# Patient Record
Sex: Female | Born: 1950 | Race: Black or African American | Hispanic: No | Smoking: Former smoker
Health system: Southern US, Community
[De-identification: ages and names within clinical notes are randomized; demographics above are authoritative.]

## PROBLEM LIST (undated history)

## (undated) DIAGNOSIS — E119 Type 2 diabetes mellitus without complications: Secondary | ICD-10-CM

## (undated) DIAGNOSIS — M792 Neuralgia and neuritis, unspecified: Secondary | ICD-10-CM

## (undated) DIAGNOSIS — K219 Gastro-esophageal reflux disease without esophagitis: Secondary | ICD-10-CM

## (undated) DIAGNOSIS — S62102A Fracture of unspecified carpal bone, left wrist, initial encounter for closed fracture: Secondary | ICD-10-CM

## (undated) DIAGNOSIS — Z972 Presence of dental prosthetic device (complete) (partial): Secondary | ICD-10-CM

## (undated) DIAGNOSIS — M549 Dorsalgia, unspecified: Secondary | ICD-10-CM

## (undated) DIAGNOSIS — K08109 Complete loss of teeth, unspecified cause, unspecified class: Secondary | ICD-10-CM

## (undated) DIAGNOSIS — M79606 Pain in leg, unspecified: Secondary | ICD-10-CM

## (undated) DIAGNOSIS — M1990 Unspecified osteoarthritis, unspecified site: Secondary | ICD-10-CM

## (undated) DIAGNOSIS — F419 Anxiety disorder, unspecified: Secondary | ICD-10-CM

## (undated) DIAGNOSIS — IMO0002 Reserved for concepts with insufficient information to code with codable children: Secondary | ICD-10-CM

## (undated) DIAGNOSIS — Z973 Presence of spectacles and contact lenses: Secondary | ICD-10-CM

## (undated) DIAGNOSIS — G43019 Migraine without aura, intractable, without status migrainosus: Secondary | ICD-10-CM

## (undated) DIAGNOSIS — I10 Essential (primary) hypertension: Secondary | ICD-10-CM

## (undated) DIAGNOSIS — J45909 Unspecified asthma, uncomplicated: Secondary | ICD-10-CM

## (undated) HISTORY — DX: Unspecified osteoarthritis, unspecified site: M19.90

## (undated) HISTORY — DX: Neuralgia and neuritis, unspecified: M79.2

## (undated) HISTORY — DX: Migraine without aura, intractable, without status migrainosus: G43.019

## (undated) HISTORY — DX: Dorsalgia, unspecified: M54.9

## (undated) HISTORY — PX: COLONOSCOPY: SHX174

## (undated) HISTORY — DX: Unspecified asthma, uncomplicated: J45.909

## (undated) HISTORY — DX: Essential (primary) hypertension: I10

## (undated) HISTORY — PX: TONSILLECTOMY: SUR1361

## (undated) HISTORY — DX: Type 2 diabetes mellitus without complications: E11.9

## (undated) HISTORY — DX: Pain in leg, unspecified: M79.606

---

## 1979-04-08 HISTORY — PX: ABDOMINAL HYSTERECTOMY: SHX81

## 2003-04-08 HISTORY — PX: BACK SURGERY: SHX140

## 2003-11-03 ENCOUNTER — Emergency Department (HOSPITAL_COMMUNITY): Admission: EM | Admit: 2003-11-03 | Discharge: 2003-11-03 | Payer: Self-pay | Admitting: Emergency Medicine

## 2003-12-14 ENCOUNTER — Emergency Department (HOSPITAL_COMMUNITY): Admission: EM | Admit: 2003-12-14 | Discharge: 2003-12-14 | Payer: Self-pay | Admitting: Emergency Medicine

## 2004-06-01 ENCOUNTER — Emergency Department (HOSPITAL_COMMUNITY): Admission: EM | Admit: 2004-06-01 | Discharge: 2004-06-01 | Payer: Self-pay | Admitting: Emergency Medicine

## 2004-06-07 ENCOUNTER — Emergency Department (HOSPITAL_COMMUNITY): Admission: EM | Admit: 2004-06-07 | Discharge: 2004-06-07 | Payer: Self-pay | Admitting: Emergency Medicine

## 2004-07-05 ENCOUNTER — Ambulatory Visit: Payer: Self-pay | Admitting: Family Medicine

## 2004-07-08 ENCOUNTER — Ambulatory Visit: Payer: Self-pay | Admitting: *Deleted

## 2004-07-15 ENCOUNTER — Ambulatory Visit: Payer: Self-pay | Admitting: Family Medicine

## 2004-07-23 ENCOUNTER — Ambulatory Visit (HOSPITAL_COMMUNITY): Admission: RE | Admit: 2004-07-23 | Discharge: 2004-07-23 | Payer: Self-pay | Admitting: Family Medicine

## 2004-07-25 ENCOUNTER — Ambulatory Visit: Payer: Self-pay | Admitting: Family Medicine

## 2004-08-08 ENCOUNTER — Ambulatory Visit: Payer: Self-pay | Admitting: Family Medicine

## 2004-08-13 ENCOUNTER — Ambulatory Visit: Payer: Self-pay | Admitting: Family Medicine

## 2004-09-19 ENCOUNTER — Ambulatory Visit: Payer: Self-pay | Admitting: Family Medicine

## 2004-10-17 ENCOUNTER — Ambulatory Visit: Payer: Self-pay | Admitting: Family Medicine

## 2004-11-01 ENCOUNTER — Ambulatory Visit: Payer: Self-pay | Admitting: Family Medicine

## 2004-11-22 ENCOUNTER — Ambulatory Visit: Payer: Self-pay | Admitting: Family Medicine

## 2005-02-21 ENCOUNTER — Ambulatory Visit: Payer: Self-pay | Admitting: Family Medicine

## 2005-04-25 ENCOUNTER — Ambulatory Visit: Payer: Self-pay | Admitting: Family Medicine

## 2005-05-02 ENCOUNTER — Ambulatory Visit: Payer: Self-pay | Admitting: Family Medicine

## 2005-05-07 ENCOUNTER — Emergency Department (HOSPITAL_COMMUNITY): Admission: EM | Admit: 2005-05-07 | Discharge: 2005-05-08 | Payer: Self-pay | Admitting: Emergency Medicine

## 2005-05-09 ENCOUNTER — Ambulatory Visit: Payer: Self-pay | Admitting: Family Medicine

## 2005-05-29 ENCOUNTER — Ambulatory Visit: Payer: Self-pay | Admitting: Family Medicine

## 2005-05-30 ENCOUNTER — Ambulatory Visit: Payer: Self-pay | Admitting: Family Medicine

## 2005-06-06 ENCOUNTER — Ambulatory Visit: Payer: Self-pay | Admitting: Family Medicine

## 2005-06-12 ENCOUNTER — Ambulatory Visit: Payer: Self-pay | Admitting: Family Medicine

## 2005-06-19 ENCOUNTER — Ambulatory Visit: Payer: Self-pay | Admitting: Internal Medicine

## 2005-07-03 ENCOUNTER — Ambulatory Visit: Payer: Self-pay | Admitting: Family Medicine

## 2005-07-07 ENCOUNTER — Ambulatory Visit: Payer: Self-pay | Admitting: Family Medicine

## 2005-07-25 ENCOUNTER — Ambulatory Visit (HOSPITAL_COMMUNITY): Admission: RE | Admit: 2005-07-25 | Discharge: 2005-07-25 | Payer: Self-pay | Admitting: Family Medicine

## 2005-08-01 ENCOUNTER — Ambulatory Visit: Payer: Self-pay | Admitting: Family Medicine

## 2005-08-21 ENCOUNTER — Ambulatory Visit (HOSPITAL_BASED_OUTPATIENT_CLINIC_OR_DEPARTMENT_OTHER): Admission: RE | Admit: 2005-08-21 | Discharge: 2005-08-21 | Payer: Self-pay | Admitting: Orthopaedic Surgery

## 2005-09-18 ENCOUNTER — Ambulatory Visit: Payer: Self-pay | Admitting: Family Medicine

## 2005-11-12 ENCOUNTER — Ambulatory Visit: Payer: Self-pay | Admitting: Family Medicine

## 2006-02-12 ENCOUNTER — Ambulatory Visit: Payer: Self-pay | Admitting: Family Medicine

## 2006-02-27 ENCOUNTER — Ambulatory Visit (HOSPITAL_COMMUNITY): Admission: RE | Admit: 2006-02-27 | Discharge: 2006-02-27 | Payer: Self-pay | Admitting: Orthopaedic Surgery

## 2006-05-20 ENCOUNTER — Inpatient Hospital Stay (HOSPITAL_COMMUNITY): Admission: RE | Admit: 2006-05-20 | Discharge: 2006-05-23 | Payer: Self-pay | Admitting: Orthopaedic Surgery

## 2006-07-31 ENCOUNTER — Ambulatory Visit: Payer: Self-pay | Admitting: Internal Medicine

## 2006-10-06 ENCOUNTER — Ambulatory Visit: Payer: Self-pay | Admitting: Family Medicine

## 2006-10-07 ENCOUNTER — Ambulatory Visit (HOSPITAL_COMMUNITY): Admission: RE | Admit: 2006-10-07 | Discharge: 2006-10-07 | Payer: Self-pay | Admitting: Family Medicine

## 2006-10-15 ENCOUNTER — Ambulatory Visit: Payer: Self-pay | Admitting: Family Medicine

## 2006-10-23 ENCOUNTER — Ambulatory Visit: Payer: Self-pay | Admitting: Internal Medicine

## 2006-11-22 DIAGNOSIS — F17200 Nicotine dependence, unspecified, uncomplicated: Secondary | ICD-10-CM | POA: Insufficient documentation

## 2006-11-22 DIAGNOSIS — I10 Essential (primary) hypertension: Secondary | ICD-10-CM | POA: Insufficient documentation

## 2006-11-22 DIAGNOSIS — N3946 Mixed incontinence: Secondary | ICD-10-CM | POA: Insufficient documentation

## 2006-11-22 DIAGNOSIS — F39 Unspecified mood [affective] disorder: Secondary | ICD-10-CM | POA: Insufficient documentation

## 2006-11-22 DIAGNOSIS — E785 Hyperlipidemia, unspecified: Secondary | ICD-10-CM

## 2006-11-22 DIAGNOSIS — R809 Proteinuria, unspecified: Secondary | ICD-10-CM | POA: Insufficient documentation

## 2006-12-22 ENCOUNTER — Telehealth (INDEPENDENT_AMBULATORY_CARE_PROVIDER_SITE_OTHER): Payer: Self-pay | Admitting: *Deleted

## 2006-12-23 ENCOUNTER — Encounter (INDEPENDENT_AMBULATORY_CARE_PROVIDER_SITE_OTHER): Payer: Self-pay | Admitting: *Deleted

## 2006-12-24 ENCOUNTER — Ambulatory Visit: Payer: Self-pay | Admitting: Internal Medicine

## 2006-12-24 DIAGNOSIS — J209 Acute bronchitis, unspecified: Secondary | ICD-10-CM

## 2006-12-29 ENCOUNTER — Ambulatory Visit: Payer: Self-pay | Admitting: Internal Medicine

## 2006-12-30 ENCOUNTER — Encounter (INDEPENDENT_AMBULATORY_CARE_PROVIDER_SITE_OTHER): Payer: Self-pay | Admitting: Internal Medicine

## 2007-01-10 DIAGNOSIS — E210 Primary hyperparathyroidism: Secondary | ICD-10-CM

## 2007-01-10 LAB — CONVERTED CEMR LAB: PTH: 105 pg/mL — ABNORMAL HIGH (ref 14.0–72.0)

## 2007-01-14 ENCOUNTER — Ambulatory Visit (HOSPITAL_COMMUNITY): Admission: RE | Admit: 2007-01-14 | Discharge: 2007-01-14 | Payer: Self-pay | Admitting: Internal Medicine

## 2007-01-21 ENCOUNTER — Telehealth (INDEPENDENT_AMBULATORY_CARE_PROVIDER_SITE_OTHER): Payer: Self-pay | Admitting: Internal Medicine

## 2007-02-03 ENCOUNTER — Ambulatory Visit: Payer: Self-pay | Admitting: Internal Medicine

## 2007-02-12 ENCOUNTER — Ambulatory Visit (HOSPITAL_COMMUNITY): Admission: RE | Admit: 2007-02-12 | Discharge: 2007-02-12 | Payer: Self-pay | Admitting: General Surgery

## 2007-03-17 ENCOUNTER — Ambulatory Visit (HOSPITAL_COMMUNITY): Admission: RE | Admit: 2007-03-17 | Discharge: 2007-03-18 | Payer: Self-pay | Admitting: General Surgery

## 2007-03-17 ENCOUNTER — Encounter (INDEPENDENT_AMBULATORY_CARE_PROVIDER_SITE_OTHER): Payer: Self-pay | Admitting: General Surgery

## 2007-03-25 ENCOUNTER — Telehealth (INDEPENDENT_AMBULATORY_CARE_PROVIDER_SITE_OTHER): Payer: Self-pay | Admitting: Internal Medicine

## 2007-04-08 HISTORY — PX: THYROID CYST EXCISION: SHX2511

## 2007-04-08 HISTORY — PX: OTHER SURGICAL HISTORY: SHX169

## 2007-04-19 ENCOUNTER — Encounter (INDEPENDENT_AMBULATORY_CARE_PROVIDER_SITE_OTHER): Payer: Self-pay | Admitting: Internal Medicine

## 2007-05-27 ENCOUNTER — Ambulatory Visit: Payer: Self-pay | Admitting: Internal Medicine

## 2007-05-27 ENCOUNTER — Ambulatory Visit (HOSPITAL_COMMUNITY): Admission: RE | Admit: 2007-05-27 | Discharge: 2007-05-27 | Payer: Self-pay | Admitting: Internal Medicine

## 2007-05-27 DIAGNOSIS — G5600 Carpal tunnel syndrome, unspecified upper limb: Secondary | ICD-10-CM

## 2007-05-27 DIAGNOSIS — M25519 Pain in unspecified shoulder: Secondary | ICD-10-CM | POA: Insufficient documentation

## 2007-06-16 ENCOUNTER — Encounter (INDEPENDENT_AMBULATORY_CARE_PROVIDER_SITE_OTHER): Payer: Self-pay | Admitting: Internal Medicine

## 2007-06-16 ENCOUNTER — Encounter: Admission: RE | Admit: 2007-06-16 | Discharge: 2007-09-14 | Payer: Self-pay | Admitting: Internal Medicine

## 2007-07-23 ENCOUNTER — Encounter (INDEPENDENT_AMBULATORY_CARE_PROVIDER_SITE_OTHER): Payer: Self-pay | Admitting: Internal Medicine

## 2007-07-23 ENCOUNTER — Encounter (INDEPENDENT_AMBULATORY_CARE_PROVIDER_SITE_OTHER): Payer: Self-pay | Admitting: Family Medicine

## 2007-07-27 ENCOUNTER — Ambulatory Visit: Payer: Self-pay | Admitting: Internal Medicine

## 2007-07-27 DIAGNOSIS — K219 Gastro-esophageal reflux disease without esophagitis: Secondary | ICD-10-CM

## 2007-07-27 DIAGNOSIS — G609 Hereditary and idiopathic neuropathy, unspecified: Secondary | ICD-10-CM | POA: Insufficient documentation

## 2007-07-27 DIAGNOSIS — J309 Allergic rhinitis, unspecified: Secondary | ICD-10-CM | POA: Insufficient documentation

## 2007-07-27 LAB — CONVERTED CEMR LAB
Albumin: 4.6 g/dL (ref 3.5–5.2)
Alkaline Phosphatase: 67 units/L (ref 39–117)
HDL: 66 mg/dL (ref >39)
LDL Cholesterol: 96 mg/dL (ref 0–99)
Total Bilirubin: 0.4 mg/dL (ref 0.3–1.2)
VLDL: 12 mg/dL (ref 0–40)

## 2007-08-08 ENCOUNTER — Encounter (INDEPENDENT_AMBULATORY_CARE_PROVIDER_SITE_OTHER): Payer: Self-pay | Admitting: Internal Medicine

## 2007-08-10 ENCOUNTER — Encounter (INDEPENDENT_AMBULATORY_CARE_PROVIDER_SITE_OTHER): Payer: Self-pay | Admitting: Family Medicine

## 2007-08-10 ENCOUNTER — Ambulatory Visit: Payer: Self-pay | Admitting: Internal Medicine

## 2007-08-16 ENCOUNTER — Ambulatory Visit: Payer: Self-pay | Admitting: Nurse Practitioner

## 2007-08-16 DIAGNOSIS — E669 Obesity, unspecified: Secondary | ICD-10-CM

## 2007-08-16 DIAGNOSIS — J329 Chronic sinusitis, unspecified: Secondary | ICD-10-CM | POA: Insufficient documentation

## 2007-08-16 LAB — CONVERTED CEMR LAB
Calcium, Total (PTH): 10 mg/dL (ref 8.4–10.5)
PTH: 39.1 pg/mL (ref 14.0–72.0)
TSH: 0.613 microintl units/mL (ref 0.350–5.50)

## 2007-08-18 ENCOUNTER — Encounter (INDEPENDENT_AMBULATORY_CARE_PROVIDER_SITE_OTHER): Payer: Self-pay | Admitting: Nurse Practitioner

## 2007-08-24 ENCOUNTER — Encounter (INDEPENDENT_AMBULATORY_CARE_PROVIDER_SITE_OTHER): Payer: Self-pay | Admitting: Internal Medicine

## 2007-09-15 ENCOUNTER — Encounter: Admission: RE | Admit: 2007-09-15 | Discharge: 2007-12-14 | Payer: Self-pay | Admitting: Internal Medicine

## 2007-09-20 ENCOUNTER — Encounter (INDEPENDENT_AMBULATORY_CARE_PROVIDER_SITE_OTHER): Payer: Self-pay | Admitting: Family Medicine

## 2007-09-21 ENCOUNTER — Ambulatory Visit: Payer: Self-pay | Admitting: Internal Medicine

## 2007-09-21 DIAGNOSIS — R609 Edema, unspecified: Secondary | ICD-10-CM

## 2007-09-22 ENCOUNTER — Encounter (INDEPENDENT_AMBULATORY_CARE_PROVIDER_SITE_OTHER): Payer: Self-pay | Admitting: Internal Medicine

## 2007-09-27 LAB — CONVERTED CEMR LAB
CO2: 27 meq/L (ref 19–32)
Calcium: 10.4 mg/dL (ref 8.4–10.5)
Creatinine, Ser: 0.75 mg/dL (ref 0.40–1.20)
Sodium: 142 meq/L (ref 135–145)

## 2007-10-01 ENCOUNTER — Ambulatory Visit: Payer: Self-pay | Admitting: Internal Medicine

## 2007-10-01 LAB — CONVERTED CEMR LAB: Blood Glucose, AC Bkfst: 118 mg/dL

## 2007-10-05 ENCOUNTER — Encounter (INDEPENDENT_AMBULATORY_CARE_PROVIDER_SITE_OTHER): Payer: Self-pay | Admitting: Internal Medicine

## 2007-10-07 ENCOUNTER — Ambulatory Visit: Payer: Self-pay | Admitting: Internal Medicine

## 2007-10-07 DIAGNOSIS — M722 Plantar fascial fibromatosis: Secondary | ICD-10-CM

## 2007-10-07 DIAGNOSIS — E119 Type 2 diabetes mellitus without complications: Secondary | ICD-10-CM

## 2007-10-07 LAB — CONVERTED CEMR LAB
Basophils Absolute: 0 10*3/uL (ref 0.0–0.1)
Basophils Relative: 0 % (ref 0–1)
Bilirubin Urine: NEGATIVE
Blood Glucose, Fingerstick: 120
Glucose, Urine, Semiquant: NEGATIVE
Ketones, urine, test strip: NEGATIVE
Lymphocytes Relative: 58 % — ABNORMAL HIGH (ref 12–46)
MCHC: 31.3 g/dL (ref 30.0–36.0)
Monocytes Absolute: 0.5 10*3/uL (ref 0.1–1.0)
Neutro Abs: 1.7 10*3/uL (ref 1.7–7.7)
OCCULT 1: NEGATIVE
OCCULT 2: NEGATIVE
OCCULT 3: NEGATIVE
Platelets: 254 10*3/uL (ref 150–400)
RDW: 14.2 % (ref 11.5–15.5)
Specific Gravity, Urine: 1.015
pH: 8

## 2007-10-11 ENCOUNTER — Ambulatory Visit (HOSPITAL_COMMUNITY): Admission: RE | Admit: 2007-10-11 | Discharge: 2007-10-11 | Payer: Self-pay | Admitting: Internal Medicine

## 2007-10-11 ENCOUNTER — Encounter (INDEPENDENT_AMBULATORY_CARE_PROVIDER_SITE_OTHER): Payer: Self-pay | Admitting: Family Medicine

## 2007-10-14 ENCOUNTER — Encounter (INDEPENDENT_AMBULATORY_CARE_PROVIDER_SITE_OTHER): Payer: Self-pay | Admitting: Family Medicine

## 2007-11-02 ENCOUNTER — Ambulatory Visit: Payer: Self-pay | Admitting: Family Medicine

## 2007-11-02 ENCOUNTER — Encounter (INDEPENDENT_AMBULATORY_CARE_PROVIDER_SITE_OTHER): Payer: Self-pay | Admitting: Internal Medicine

## 2008-04-03 ENCOUNTER — Encounter (INDEPENDENT_AMBULATORY_CARE_PROVIDER_SITE_OTHER): Payer: Self-pay | Admitting: Family Medicine

## 2008-04-06 ENCOUNTER — Ambulatory Visit: Payer: Self-pay | Admitting: Internal Medicine

## 2008-04-06 LAB — CONVERTED CEMR LAB: Hgb A1c MFr Bld: 5.7 %

## 2008-04-11 ENCOUNTER — Emergency Department (HOSPITAL_COMMUNITY): Admission: EM | Admit: 2008-04-11 | Discharge: 2008-04-11 | Payer: Self-pay | Admitting: Emergency Medicine

## 2008-08-01 ENCOUNTER — Ambulatory Visit: Payer: Self-pay | Admitting: Internal Medicine

## 2008-08-01 DIAGNOSIS — B351 Tinea unguium: Secondary | ICD-10-CM | POA: Insufficient documentation

## 2008-08-01 DIAGNOSIS — R252 Cramp and spasm: Secondary | ICD-10-CM

## 2008-08-07 ENCOUNTER — Telehealth (INDEPENDENT_AMBULATORY_CARE_PROVIDER_SITE_OTHER): Payer: Self-pay | Admitting: Internal Medicine

## 2008-08-10 LAB — CONVERTED CEMR LAB
AST: 17 units/L (ref 0–37)
Albumin: 4.6 g/dL (ref 3.5–5.2)
Alkaline Phosphatase: 81 units/L (ref 39–117)
BUN: 20 mg/dL (ref 6–23)
Glucose, Bld: 97 mg/dL (ref 70–99)
Potassium: 4.6 meq/L (ref 3.5–5.3)
Sodium: 140 meq/L (ref 135–145)
Total Bilirubin: 0.5 mg/dL (ref 0.3–1.2)
Total Protein: 8.3 g/dL (ref 6.0–8.3)

## 2008-08-11 ENCOUNTER — Ambulatory Visit: Payer: Self-pay | Admitting: Internal Medicine

## 2008-10-05 ENCOUNTER — Ambulatory Visit: Payer: Self-pay | Admitting: Internal Medicine

## 2008-10-05 LAB — CONVERTED CEMR LAB: Hgb A1c MFr Bld: 6.1 %

## 2008-10-11 ENCOUNTER — Ambulatory Visit (HOSPITAL_COMMUNITY): Admission: RE | Admit: 2008-10-11 | Discharge: 2008-10-11 | Payer: Self-pay | Admitting: Internal Medicine

## 2008-10-13 LAB — CONVERTED CEMR LAB
Eosinophils Absolute: 0.1 10*3/uL (ref 0.0–0.7)
Eosinophils Relative: 1 % (ref 0–5)
HCT: 43.4 % (ref 36.0–46.0)
HDL: 55 mg/dL (ref >39)
Hemoglobin: 13.7 g/dL (ref 12.0–15.0)
LDL Cholesterol: 82 mg/dL (ref 0–99)
Lymphocytes Relative: 50 % — ABNORMAL HIGH (ref 12–46)
Lymphs Abs: 3.3 10*3/uL (ref 0.7–4.0)
MCV: 93.5 fL (ref 78.0–100.0)
Microalb, Ur: 7.22 mg/dL — ABNORMAL HIGH (ref 0.00–1.89)
Monocytes Absolute: 0.6 10*3/uL (ref 0.1–1.0)
Monocytes Relative: 9 % (ref 3–12)
Total CHOL/HDL Ratio: 2.9
Triglycerides: 116 mg/dL (ref <150)
VLDL: 23 mg/dL (ref 0–40)
WBC: 6.5 10*3/uL (ref 4.0–10.5)

## 2008-10-19 ENCOUNTER — Telehealth (INDEPENDENT_AMBULATORY_CARE_PROVIDER_SITE_OTHER): Payer: Self-pay | Admitting: *Deleted

## 2009-01-02 ENCOUNTER — Ambulatory Visit: Payer: Self-pay | Admitting: Internal Medicine

## 2009-01-02 DIAGNOSIS — R109 Unspecified abdominal pain: Secondary | ICD-10-CM

## 2009-01-02 DIAGNOSIS — J45909 Unspecified asthma, uncomplicated: Secondary | ICD-10-CM | POA: Insufficient documentation

## 2009-01-02 LAB — CONVERTED CEMR LAB
Cholesterol: 143 mg/dL (ref 0–200)
HDL: 51 mg/dL (ref >39)
Total CHOL/HDL Ratio: 2.8
Triglycerides: 75 mg/dL (ref <150)
VLDL: 15 mg/dL (ref 0–40)
Whiff Test: POSITIVE

## 2009-01-05 ENCOUNTER — Ambulatory Visit (HOSPITAL_COMMUNITY): Admission: RE | Admit: 2009-01-05 | Discharge: 2009-01-05 | Payer: Self-pay | Admitting: Internal Medicine

## 2009-01-17 ENCOUNTER — Encounter (INDEPENDENT_AMBULATORY_CARE_PROVIDER_SITE_OTHER): Payer: Self-pay | Admitting: Internal Medicine

## 2009-01-23 ENCOUNTER — Ambulatory Visit: Payer: Self-pay | Admitting: Internal Medicine

## 2009-02-01 ENCOUNTER — Encounter (INDEPENDENT_AMBULATORY_CARE_PROVIDER_SITE_OTHER): Payer: Self-pay | Admitting: Internal Medicine

## 2009-02-01 DIAGNOSIS — D126 Benign neoplasm of colon, unspecified: Secondary | ICD-10-CM

## 2009-02-01 DIAGNOSIS — K648 Other hemorrhoids: Secondary | ICD-10-CM | POA: Insufficient documentation

## 2009-02-01 DIAGNOSIS — K5730 Diverticulosis of large intestine without perforation or abscess without bleeding: Secondary | ICD-10-CM | POA: Insufficient documentation

## 2009-02-09 ENCOUNTER — Encounter (INDEPENDENT_AMBULATORY_CARE_PROVIDER_SITE_OTHER): Payer: Self-pay | Admitting: Internal Medicine

## 2009-02-13 ENCOUNTER — Ambulatory Visit: Payer: Self-pay | Admitting: Internal Medicine

## 2009-02-13 LAB — CONVERTED CEMR LAB
ALT: 18 units/L (ref 0–35)
Albumin: 4.8 g/dL (ref 3.5–5.2)
Indirect Bilirubin: 0.6 mg/dL (ref 0.0–0.9)
Total Protein: 7.8 g/dL (ref 6.0–8.3)

## 2009-03-12 ENCOUNTER — Encounter (INDEPENDENT_AMBULATORY_CARE_PROVIDER_SITE_OTHER): Payer: Self-pay | Admitting: Internal Medicine

## 2009-03-26 ENCOUNTER — Ambulatory Visit: Payer: Self-pay | Admitting: Internal Medicine

## 2009-03-27 ENCOUNTER — Encounter (INDEPENDENT_AMBULATORY_CARE_PROVIDER_SITE_OTHER): Payer: Self-pay | Admitting: Internal Medicine

## 2009-05-04 LAB — CONVERTED CEMR LAB
ALT: 18 units/L (ref 0–35)
Bilirubin, Direct: 0.1 mg/dL (ref 0.0–0.3)

## 2009-05-09 ENCOUNTER — Ambulatory Visit: Payer: Self-pay | Admitting: Internal Medicine

## 2009-05-09 DIAGNOSIS — J069 Acute upper respiratory infection, unspecified: Secondary | ICD-10-CM | POA: Insufficient documentation

## 2009-05-09 LAB — CONVERTED CEMR LAB: Hgb A1c MFr Bld: 5.8 %

## 2009-09-13 ENCOUNTER — Encounter (INDEPENDENT_AMBULATORY_CARE_PROVIDER_SITE_OTHER): Payer: Self-pay | Admitting: Internal Medicine

## 2009-10-21 ENCOUNTER — Encounter (INDEPENDENT_AMBULATORY_CARE_PROVIDER_SITE_OTHER): Payer: Self-pay | Admitting: Internal Medicine

## 2009-10-25 ENCOUNTER — Ambulatory Visit (HOSPITAL_COMMUNITY): Admission: RE | Admit: 2009-10-25 | Discharge: 2009-10-25 | Payer: Self-pay | Admitting: Internal Medicine

## 2009-12-07 ENCOUNTER — Ambulatory Visit: Payer: Self-pay | Admitting: Internal Medicine

## 2009-12-07 DIAGNOSIS — N760 Acute vaginitis: Secondary | ICD-10-CM | POA: Insufficient documentation

## 2009-12-07 DIAGNOSIS — R82998 Other abnormal findings in urine: Secondary | ICD-10-CM | POA: Insufficient documentation

## 2009-12-07 LAB — CONVERTED CEMR LAB
Bilirubin Urine: NEGATIVE
Blood Glucose, Fingerstick: 95
Urobilinogen, UA: 1
Whiff Test: POSITIVE
pH: 6

## 2009-12-08 ENCOUNTER — Encounter (INDEPENDENT_AMBULATORY_CARE_PROVIDER_SITE_OTHER): Payer: Self-pay | Admitting: Internal Medicine

## 2009-12-11 ENCOUNTER — Encounter (INDEPENDENT_AMBULATORY_CARE_PROVIDER_SITE_OTHER): Payer: Self-pay | Admitting: Internal Medicine

## 2009-12-11 LAB — CONVERTED CEMR LAB
ALT: 14 units/L (ref 0–35)
BUN: 18 mg/dL (ref 6–23)
CO2: 29 meq/L (ref 19–32)
Calcium: 10.7 mg/dL — ABNORMAL HIGH (ref 8.4–10.5)
Chloride: 105 meq/L (ref 96–112)
Cholesterol: 180 mg/dL (ref 0–200)
Creatinine, Ser: 0.73 mg/dL (ref 0.40–1.20)
Creatinine, Urine: 204.6 mg/dL
Glucose, Bld: 85 mg/dL (ref 70–99)
HDL: 49 mg/dL (ref >39)
Hemoglobin: 13.4 g/dL (ref 12.0–15.0)
Lymphocytes Relative: 59 % — ABNORMAL HIGH (ref 12–46)
Microalb Creat Ratio: 127.7 mg/g — ABNORMAL HIGH (ref 0.0–30.0)
Monocytes Absolute: 0.5 10*3/uL (ref 0.1–1.0)
Monocytes Relative: 8 % (ref 3–12)
Neutro Abs: 2 10*3/uL (ref 1.7–7.7)
RBC: 4.51 M/uL (ref 3.87–5.11)
Total CHOL/HDL Ratio: 3.7

## 2009-12-14 ENCOUNTER — Telehealth (INDEPENDENT_AMBULATORY_CARE_PROVIDER_SITE_OTHER): Payer: Self-pay | Admitting: Internal Medicine

## 2009-12-14 ENCOUNTER — Ambulatory Visit: Payer: Self-pay | Admitting: Internal Medicine

## 2009-12-24 ENCOUNTER — Encounter (INDEPENDENT_AMBULATORY_CARE_PROVIDER_SITE_OTHER): Payer: Self-pay | Admitting: Internal Medicine

## 2009-12-24 LAB — CONVERTED CEMR LAB: OCCULT 1: NEGATIVE

## 2009-12-25 ENCOUNTER — Telehealth (INDEPENDENT_AMBULATORY_CARE_PROVIDER_SITE_OTHER): Payer: Self-pay | Admitting: Internal Medicine

## 2009-12-25 ENCOUNTER — Encounter (INDEPENDENT_AMBULATORY_CARE_PROVIDER_SITE_OTHER): Payer: Self-pay | Admitting: Internal Medicine

## 2009-12-27 ENCOUNTER — Ambulatory Visit: Payer: Self-pay | Admitting: Internal Medicine

## 2009-12-27 DIAGNOSIS — N390 Urinary tract infection, site not specified: Secondary | ICD-10-CM | POA: Insufficient documentation

## 2009-12-27 LAB — CONVERTED CEMR LAB
Glucose, Urine, Semiquant: NEGATIVE
Protein, U semiquant: 30
Specific Gravity, Urine: >=1.03

## 2009-12-28 ENCOUNTER — Encounter (INDEPENDENT_AMBULATORY_CARE_PROVIDER_SITE_OTHER): Payer: Self-pay | Admitting: Internal Medicine

## 2010-01-02 LAB — CONVERTED CEMR LAB: Calcium, Total (PTH): 10.7 mg/dL — ABNORMAL HIGH (ref 8.4–10.5)

## 2010-01-10 ENCOUNTER — Ambulatory Visit: Payer: Self-pay | Admitting: Physician Assistant

## 2010-01-10 LAB — CONVERTED CEMR LAB
Protein, U semiquant: 30
Specific Gravity, Urine: 1.015
Urobilinogen, UA: 1
pH: 7.5

## 2010-01-11 ENCOUNTER — Encounter (INDEPENDENT_AMBULATORY_CARE_PROVIDER_SITE_OTHER): Payer: Self-pay | Admitting: Internal Medicine

## 2010-02-12 ENCOUNTER — Ambulatory Visit: Payer: Self-pay | Admitting: Internal Medicine

## 2010-02-13 ENCOUNTER — Encounter (INDEPENDENT_AMBULATORY_CARE_PROVIDER_SITE_OTHER): Payer: Self-pay | Admitting: Internal Medicine

## 2010-02-13 LAB — CONVERTED CEMR LAB
Glucose, Urine, Semiquant: NEGATIVE
Nitrite: NEGATIVE
Protein, U semiquant: 100
Specific Gravity, Urine: 1.02
Urobilinogen, UA: 2

## 2010-02-21 ENCOUNTER — Ambulatory Visit: Payer: Self-pay | Admitting: Internal Medicine

## 2010-03-04 LAB — CONVERTED CEMR LAB
Albumin: 4.4 g/dL (ref 3.5–5.2)
Bilirubin, Direct: 0.1 mg/dL (ref 0.0–0.3)
LDL Cholesterol: 80 mg/dL (ref 0–99)
Total Bilirubin: 0.4 mg/dL (ref 0.3–1.2)
Total CHOL/HDL Ratio: 2.6
VLDL: 14 mg/dL (ref 0–40)

## 2010-03-14 ENCOUNTER — Ambulatory Visit: Payer: Self-pay | Admitting: Internal Medicine

## 2010-03-15 ENCOUNTER — Encounter (INDEPENDENT_AMBULATORY_CARE_PROVIDER_SITE_OTHER): Payer: Self-pay | Admitting: Internal Medicine

## 2010-03-16 ENCOUNTER — Encounter (INDEPENDENT_AMBULATORY_CARE_PROVIDER_SITE_OTHER): Payer: Self-pay | Admitting: Internal Medicine

## 2010-03-16 LAB — CONVERTED CEMR LAB
Calcium: 10.4 mg/dL (ref 8.4–10.5)
PTH: 50.6 pg/mL (ref 14.0–72.0)

## 2010-03-26 ENCOUNTER — Encounter (INDEPENDENT_AMBULATORY_CARE_PROVIDER_SITE_OTHER): Payer: Self-pay | Admitting: Internal Medicine

## 2010-05-01 ENCOUNTER — Telehealth (INDEPENDENT_AMBULATORY_CARE_PROVIDER_SITE_OTHER): Payer: Self-pay | Admitting: Internal Medicine

## 2010-05-07 ENCOUNTER — Encounter (INDEPENDENT_AMBULATORY_CARE_PROVIDER_SITE_OTHER): Payer: Self-pay | Admitting: Internal Medicine

## 2010-05-07 ENCOUNTER — Ambulatory Visit: Admit: 2010-05-07 | Payer: Self-pay | Admitting: Internal Medicine

## 2010-05-07 DIAGNOSIS — J019 Acute sinusitis, unspecified: Secondary | ICD-10-CM | POA: Insufficient documentation

## 2010-05-07 NOTE — Consult Note (Signed)
Summary: [facility]  [facility]   Imported By: Arta Bruce 12/13/2009 12:38:56  _____________________________________________________________________  External Attachment:    Type:   Image     Comment:   External Document

## 2010-05-07 NOTE — Progress Notes (Signed)
Summary: Needs PA for Lipitor  Phone Note Outgoing Call   Summary of Call: Needs PA for Lipitor -- has no earlier medication trials.  Do you want to proceed with PA? Initial call taken by: Dutch Quint RN,  December 14, 2009 11:32 AM  Follow-up for Phone Call        Switch to Pravastatin 80 mg for 2 months and schedule her to come in for FLP and liver profile at the end of that time. Rx switched To call if problems with the switch Let her know her labs were fine except for a slightly high calcium, so that's why we are rechecking her PTH. Follow-up by: Julieanne Manson MD,  December 14, 2009 5:58 PM  Additional Follow-up for Phone Call Additional follow up Details #1::        Lab appt. 02/21/10.  Advised of new Rx and lab results.  Dutch Quint RN  December 17, 2009 9:34 AM     New/Updated Medications: PRAVASTATIN SODIUM 80 MG TABS (PRAVASTATIN SODIUM) 1 tab by mouth daily with meal Prescriptions: PRAVASTATIN SODIUM 80 MG TABS (PRAVASTATIN SODIUM) 1 tab by mouth daily with meal  #30 x 11   Entered and Authorized by:   Julieanne Manson MD   Signed by:   Julieanne Manson MD on 12/14/2009   Method used:   Electronically to        [facility] [address] [phone]* (retail)       [address]       [place], [place]  [address]       Ph: [phone]       Fax: [phone]   RxID:   [id number]

## 2010-05-07 NOTE — Assessment & Plan Note (Signed)
Summary: CPP///KT   Vital Signs:  Patient profile:   60 year old female Menstrual status:  hysterectomy Height:      62.5 inches Weight:      150.4 pounds BMI:     27.17 Temp:     98.4 degrees F oral Pulse rate:   88 / minute Pulse rhythm:   regular Resp:     16 per minute BP sitting:   96 / 74  (left arm) Cuff size:   regular  Vitals Entered By: Michelle Nasuti (December 07, 2009 10:58 AM) CC: CPP Is Patient Diabetic? Yes CBG Result 95 CBG Device ID FAST [id number]  Does patient need assistance? Functional Status Self care Ambulation Normal   CC:  CPP.  History of Present Illness: 60 yo female here for CPP.    Allergies: 1)  Codeine  Past History:  Past Medical History: Reviewed history from 10/07/2007 and no changes required. 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 1.  1970s:  Hysterectomy (TAH)with right oophorectomy--fibroids 2.  1989   left knee arthroscopy 3.  05/2006:  L4/5 discectomy and fusion:  Dr. Sharolyn Douglas 4.  03/16/2007:  Parathyroid Adenoma excision 5.  02/08/08:  Dr Amanda Pea:  right carpal tunnel release and repair to right thumb MCP joint  Family History: Mother, 11:  No contact--health history unknown. Father, died 20:  Alzheimers, gout--died complications of dementia--alcoholism  as well. 5 siblings:  healthy 2 1/2 siblings she knows nothing about Son, 68:  healthy Daughter, 35:  allergies.  Social History: Reconciled with husband--he is participating in things she likes.   Unemployed--back problems Lives with husband. Children live in town Tobacco Use:  quit in 2008.  Smoked 20 years less than 1 ppd Alcohol use :  none Drug Use:  never  Review of Systems General:  Energy is good. Not sleeping well.  Problem for 2 months--got really busy and illness in family.  Problems initiation or staying asleep.  No significant caffeine.. Eyes:  Denies blurring; [facility]--no diabetic changes. ENT:  Denies decreased hearing. CV:  Denies chest pain or discomfort and palpitations. Resp:  SOB when pollen is high only. GI:  Denies abdominal pain, bloody stools, dark tarry stools, and diarrhea; constipation only with pain meds. GU:  dysuria  past week.  has had some pruritis as well.  No discharge. MS:  Denies joint pain, joint redness, and joint swelling; Does have pain of 1st MTP joint of right foot--trying to wear wider shoes for this... Derm:  Denies rash; lesion on right breast.. Neuro:  Denies numbness, tingling, and weakness. Psych:  Denies anxiety, depression, and suicidal thoughts/plans; PQH9 scored 1.  Physical Exam  General:  Well-developed,well-nourished,in no acute distress; alert,appropriate and cooperative throughout examination Head:  Normocephalic and atraumatic without obvious abnormalities. No apparent alopecia or balding. Eyes:  No corneal or conjunctival inflammation noted. EOMI. Perrla. Funduscopic exam benign, without hemorrhages, exudates or papilledema. Vision grossly normal. Ears:  External ear exam shows no significant lesions or deformities.  Otoscopic examination reveals clear canals, tympanic membranes are intact bilaterally without bulging, retraction, inflammation  or discharge. Hearing is grossly normal bilaterally. Nose:   External nasal examination shows no deformity or inflammation. Nasal mucosa are pink and moist without lesions or exudates. Mouth:  Oral mucosa and oropharynx without lesions or exudates.  fair dentition.   Neck:  No deformities, masses, or tenderness noted. Chest Wall:  No deformities, masses, or tenderness noted. Breasts:  No mass, nodules, thickening, tenderness, bulging, retraction, inflamation, nipple discharge or skin changes noted.   Lungs:  Normal respiratory effort, chest expands symmetrically. Lungs are clear to auscultation, no crackles or wheezes. Heart:  Normal rate and regular rhythm. S1 and S2 normal without gallop, murmur, click, rub or other extra sounds. Abdomen:  Bowel sounds positive,abdomen soft and non-tender without masses, organomegaly or hernias noted. Rectal:  No external abnormalities noted. Normal sphincter tone. No rectal masses or tenderness.  Heme negative light brown stool Genitalia:  Pelvic Exam:        External: normal female genitalia without lesions or masses        Vagina: normal without lesions or masses, but with some frothy light yellow discharge with strong amine odor.        Cervix: absent        Adnexa: normal bimanual exam without masses or fullness        Uterus: absent on bimanual exam        Pap smear: not performed  Diabetes Management Exam:    Foot Exam (with socks and/or shoes not present):       Sensory-Monofilament:          Left foot: normal          Right foot: normal   Impression & Recommendations:  Problem # 1:  ROUTINE GYNECOLOGICAL EXAMINATION (ICD-V72.31)  Mammogram up to date  Orders: T- GC Chlamydia ([address]) T-HIV Antibody  (Reflex) (60454-09811) T-Pap Smear, Thin Prep (91478) T-Syphilis Test (RPR) (29562-13086)  Problem # 2:  VAGINITIS, BACTERIAL (ICD-616.10)  Her updated medication list for this problem includes:    Metronidazole 500 Mg Tabs (Metronidazole) .[name]... 1 tab by mouth two times a day for 7 days  Problem #  3:  URINALYSIS, ABNORMAL (ICD-791.9)  Orders: T-Culture, Urine ([id number])  Problem # 4:  HEALTH MAINTENANCE EXAM (ICD-V70.0) Guaiac cards X 3 to return in 2 weeks. Orders: T-Comprehensive Metabolic Panel [phone]) T-CBC w/Diff ([id number])  Problem # 5:  DIABETES MELLITUS, TYPE II, CONTROLLED (ICD-250.00)  Her updated medication list for this problem includes:    Benazepril Hcl 10 Mg Tabs (Benazepril hcl) .[name]... 1 tab by mouth daily    Adult Aspirin Ec Low Strength 81 Mg Tbec (Aspirin) .[name]... 1 tab by mouth daily    Metformin Hcl 500 Mg Tabs (Metformin hcl) .[name]... 1 tab by mouth daily with largest meal  Orders: Capillary Blood Glucose/CBG (03474) T-Comprehensive Metabolic Panel (25956-38756) T-CBC w/Diff (43329-51884) T- Hemoglobin A1C (16606-30160) T-Urine Microalbumin w/creat. ratio [phone])  Problem # 6:  HYPERTENSION, BENIGN ESSENTIAL (ICD-401.1) controlled Her updated medication list for this problem includes:    Benazepril Hcl 10 Mg Tabs (Benazepril hcl) .[name]... 1 tab by mouth daily    Norvasc 5 Mg Tabs (Amlodipine besylate) .[name]... 1 by mouth qam    Hydrochlorothiazide 25 Mg Tabs (Hydrochlorothiazide) .[name]... Take 1 tablet by mouth once a day  Orders: T-Comprehensive Metabolic Panel ([id number]) T-CBC w/Diff (28315-17616)  Complete Medication List: 1)  Lipitor 20 Mg Tabs (Atorvastatin calcium) .... 2 tabs at night for cholesterol--dose increase 2)  Benazepril Hcl 10 Mg Tabs (Benazepril hcl) .Marland KitchenMarland KitchenMarland Kitchen  1 tab by mouth daily 3)  Norvasc 5 Mg Tabs (Amlodipine besylate) .[name].. 1 by mouth qam 4)  Hydrochlorothiazide 25 Mg Tabs (Hydrochlorothiazide) .... Take 1 tablet by mouth once a day 5)  Nasacort Aq 55 Mcg/act Aers (Triamcinolone acetonide(nasal)) .... 2squirts qnostril qday 6)  Proventil Hfa 108 (90 Base) Mcg/act Aers (Albuterol sulfate) .... 2 puffs every 6 hours as needed for shortness of breath 7)  Naproxen 500 Mg Tabs (Naproxen) .[name].. 1 tab by mouth two times a  day with meals 8)  Protonix 40 Mg Tbec (Pantoprazole sodium) .[name].. 1 tab by mouth daily 9)  Singulair 10 Mg Tabs (Montelukast sodium) .[name].. 1 tab by mouth daily 10)  Adult Aspirin Ec Low Strength 81 Mg Tbec (Aspirin) .[name].. 1 tab by mouth daily 11)  Metformin Hcl 500 Mg Tabs (Metformin hcl) .[name].. 1 tab by mouth daily with largest meal 12)  Allegra 180 Mg Tabs (Fexofenadine hcl) .[name].. 1 tablet by mouth daily for allergies 13)  Advair Diskus 100-50 Mcg/dose Misc (Fluticasone-salmeterol) .[name].. 1 inhalation two times a day 14)  Lamisil 250 Mg Tabs (Terbinafine hcl) .[name].. 1 tab by mouth daily for 6 weeks. 15)  Tramadol Hcl 50 Mg Tabs (Tramadol hcl) .[name].. 1 tab by mouth two times a day --dr. Noel Gerold 16)  Cyclobenzaprine Hcl 10 Mg Tabs (Cyclobenzaprine hcl) .[name].. 1 tab by mouth every 8 hours as needed for muscle spasm--dr. Noel Gerold. 17)  Metronidazole 500 Mg Tabs (Metronidazole) .[name].. 1 tab by mouth two times a day for 7 days  Other Orders: T-Lipid Profile ([id number]) UA Dipstick w/o Micro (manual) (09811)  Patient Instructions: 1)  Follow up with Dr. Delrae Alfred in 6 months --DM, htn, etc Prescriptions: ADVAIR DISKUS 100-50 MCG/DOSE MISC (FLUTICASONE-SALMETEROL) 1 inhalation two times a day  #1 x 11   Entered and Authorized by:   Julieanne Manson MD   Signed by:   Julieanne Manson MD on 12/11/2009   Method used:   Electronically to        [facility] [address] [phone]* (retail)       [address]       [place], [place]  [address]       Ph: [phone]       Fax: [phone]   RxID:   [id number] ALLEGRA 180 MG TABS (FEXOFENADINE HCL) 1 tablet by mouth daily for allergies  #30 x 11   Entered and Authorized by:   Julieanne Manson MD   Signed by:   Julieanne Manson MD on 12/11/2009   Method used:   Electronically to        [facility] [address] [phone]* (retail)       [address]       [place], [place]  [address]       Ph: [phone]       Fax: [phone]   RxID:   [id number] METFORMIN  HCL 500 MG TABS (METFORMIN HCL) 1 tab by mouth daily with largest meal  #30 x 11   Entered and Authorized by:   Julieanne Manson MD   Signed by:   Julieanne Manson MD on 12/11/2009   Method used:   Electronically to        [facility] [address] [phone]* (retail)       [address]       [place], [place]  [address]       Ph: [phone]       Fax: [phone]   RxID:   [id number] SINGULAIR  10 MG  TABS (MONTELUKAST SODIUM) 1 tab by mouth daily  #30 x 11   Entered and Authorized by:   Julieanne Manson MD   Signed by:   Julieanne Manson MD on 12/11/2009   Method used:   Electronically to        [facility] [address] [phone]* (retail)       [address]       [place], [place]  [address]       Ph: [phone]       Fax: [phone]   RxID:   [id number] PROTONIX 40 MG  TBEC (PANTOPRAZOLE SODIUM) 1 tab by mouth daily  #30 x 11   Entered and Authorized by:   Julieanne Manson MD   Signed by:   Julieanne Manson MD on 12/11/2009   Method used:   Electronically to        [facility] [phone]* (retail)       [address]       [place], [place]  [address]       Ph: [phone]       Fax: [phone]   RxID:   [id number] NASACORT AQ 55 MCG/ACT AERS (TRIAMCINOLONE ACETONIDE(NASAL)) 2squirts qnostril qday  #1 x 11   Entered and Authorized by:   Julieanne Manson MD   Signed by:   Julieanne Manson MD on 12/11/2009   Method used:   Electronically to        [facility] [address] [phone]* (retail)       [address]       [place], [place]  [address]       Ph: [phone]       Fax: [phone]   RxID:   [id number] HYDROCHLOROTHIAZIDE 25 MG TABS (HYDROCHLOROTHIAZIDE) Take 1 tablet by mouth once a day  #30 x 11   Entered and Authorized by:   Julieanne Manson MD   Signed by:   Julieanne Manson MD on 12/11/2009   Method used:   Electronically to        [facility] [address] [phone]* (retail)       [address]       [place], [place]  [address]       Ph:  [phone]       Fax: [phone]   RxID:   [id number] NORVASC 5 MG TABS (AMLODIPINE BESYLATE) 1 by mouth qam  #30 x 11   Entered and Authorized by:   Julieanne Manson MD   Signed by:   Julieanne Manson MD on 12/11/2009   Method used:   Electronically to        [facility] [address] [phone]* (retail)       [address]       [place], [place]  [address]       Ph: [phone]       Fax: [phone]   RxID:   [id number] BENAZEPRIL HCL 10 MG TABS (BENAZEPRIL HCL) 1 tab by mouth daily  #30 x 11   Entered and Authorized by:   Julieanne Manson MD   Signed by:   Julieanne Manson MD on 12/11/2009   Method used:   Electronically to        [facility] [address] [phone]* (retail)       [address]       [place], [place]  [address]       Ph: [phone]       Fax: [phone]   RxID:  [id number] LIPITOR 20 MG  TABS (ATORVASTATIN CALCIUM) 2 tabs at night for cholesterol--dose increase  #60 x 11   Entered and Authorized by:   Julieanne Manson MD   Signed by:   Julieanne Manson MD on 12/11/2009   Method used:   Electronically to        [facility] [address] [phone]* (retail)       [address]       [place], [place]  [address]       Ph: [phone]       Fax: [phone]   RxID:   [id number] METRONIDAZOLE 500 MG TABS (METRONIDAZOLE) 1 tab by mouth two times a day for 7 days  #14 x 0   Entered and Authorized by:   Julieanne Manson MD   Signed by:   Julieanne Manson MD on 12/07/2009   Method used:   Electronically to        [facility] [address] [phone]* (retail)       [address]       [place], [place]  [address]       Ph: [phone]       Fax: [phone]   RxID:   [id number]   Laboratory Results   Urine Tests  Date/Time Received: December 07, 2009 11:00 AM   Routine Urinalysis   Color: lt. yellow Appearance: Hazy Glucose: negative   (Normal Range: Negative) Bilirubin: negative   (Normal Range: Negative) Ketone: trace (5)   (Normal Range:  Negative) Spec. Gravity: >=1.030   (Normal Range: 1.003-1.035) Blood: trace-intact   (Normal Range: Negative) pH: 6.0   (Normal Range: 5.0-8.0) Protein: 100   (Normal Range: Negative) Urobilinogen: 1.0   (Normal Range: 0-1) Nitrite: negative   (Normal Range: Negative) Leukocyte Esterace: moderate   (Normal Range: Negative)     Blood Tests     CBG Random:: 95    Wet Mount/KOH Source: vaginal WBC/hpf: 1-5 Bacteria/hpf: 2+ Clue cells/hpf: many  Positive whiff Yeast/hpf: none Trichomonas/hpf: none     Preventive Care Screening  Prior Values:    Mammogram:  ASSESSMENT: Negative - BI-RADS 1^MM DIGITAL SCREENING (10/25/2009)    Colonoscopy:  Dr. [facility]:  Multiple recto-sigmoid polyps.  Small internal hemorrhoids, Diverticulosis. (02/01/2009)    Last Tetanus Booster:  Td (07/06/2004)    Last Flu Shot:  Fluvax 3+ (01/02/2009)    Last Pneumovax:  Pneumovax (11/05/2004)     SBE:  no changes--mole on right breast--may have gotten bigger. Guaiac Cards:  not since before colonoscopy Osteoprevention:  DEXA in 2009 showed osteoporosis--had parathyroid gland adenoma excision the year before. Not taking calcium and Vitamin D.  Walks and stays physically active.   Last LDL:                                                 77 (01/02/2009 10:52:00 PM)        Diabetic Foot Exam    10-g (5.07) Semmes-Weinstein Monofilament Test Performed by: Michelle Nasuti          Right Foot          Left Foot Visual Inspection               Test Control      normal         normal Site 1  normal         normal Site 2         normal         normal Site 3         normal         normal Site 4         normal         normal Site 5         normal         normal Site 6         normal         normal Site 7         normal         normal Site 8         normal         normal Site 9         normal         normal Site 10         normal         normal  Impression      normal          normal

## 2010-05-07 NOTE — Assessment & Plan Note (Signed)
Summary: FOLLOW UP WITH DR Pamela Dalton IN 4 MONTHS-ONYCHOMYCOSIS/DM//GK   Vital Signs:  Patient profile:   60 year old female Menstrual status:  hysterectomy Weight:      156 pounds Temp:     97.7 degrees F Pulse rate:   85 / minute Pulse rhythm:   regular Resp:     20 per minute BP sitting:   114 / 80  (left arm) Cuff size:   regular  Vitals Entered By: [name] CMA (May 09, 2009 9:16 AM) CC: f/u check on dm, nail fungus.  Has lost 28 pounds since Sept.  Has been trying to. Is Patient Diabetic? Yes Pain Assessment Patient in pain? yes     Location: back Intensity: 7  Does patient need assistance? Ambulation Normal   CC:  f/u check on dm and nail fungus.  Has lost 28 pounds since Sept.  Has been trying to.[name]  History of Present Illness: 1.  Toenail onychomycosis:  doing well.  Finished Lamisil at least 2 weeks ago and only has a small spot of involved nails on both great toes now--growing out.  2.  Left pelvic pain:  pt. did have screening colonoscopy before Christmas--had 12 polyps.  Pt. is certain her pain resolved with removal of polyps.  She is not certain of when she needs to follow up.  Dr. Bosie Dalton with [facility] performed the procedure.  3.  Glaucoma suspect:  pt. has a call into [facility]--is waiting for a call back.  4.  DM:  Weight down to 184 and A1C at 5.8%.  Pt. watching what she eats and is exercising regularly.  Feels well.    5.  Hyperlipidemia:  basically at goal with last check--LDL at 74.  6.  Hypertension:  controlled.  7.  Tickling cough in throat with lots of mucous draining down throat--gags on mucous and vomits in toilet.  Had URI symptoms about 1 month ago and this is what is left.  Using Allegra and Nasocort regularly.  Allergies (verified): 1)  Codeine  Physical Exam  General:  NAD Eyes:  No corneal or conjunctival inflammation noted. EOMI. Perrla. Funduscopic exam benign, without hemorrhages, exudates or papilledema. Vision  grossly normal. Ears:  External ear exam shows no significant lesions or deformities.  Otoscopic examination reveals clear canals, tympanic membranes are intact bilaterally without bulging, retraction, inflammation or discharge. Hearing is grossly normal bilaterally. Nose:  External nasal examination shows no deformity or inflammation. Nasal mucosa are pink and moist without lesions or exudates. Mouth:  pharynx pink and moist.   Neck:  No deformities, masses, or tenderness noted. Lungs:  Normal respiratory effort, chest expands symmetrically. Lungs are clear to auscultation, no crackles or wheezes. Heart:  Normal rate and regular rhythm. S1 and S2 normal without gallop, murmur, click, rub or other extra sounds.  Radial pulses normal and equal Extremities:  small yellowing and thickening of medial great toenails bilaterally   Impression & Recommendations:  Problem # 1:  GLAUCOMA, POSSIBLE (ICD-365.9) Pt. to call if she has not heard back from eye doc in next 2 weeks.  Problem # 2:  COLONIC POLYPS (ICD-211.3) Will check for records from Dr. Bosie Dalton  Problem # 3:  DIABETES MELLITUS, TYPE II, CONTROLLED (ICD-250.00) Doing quite well. Would like to see her weight at 138 for BMI of 25--pt. motivated. Her updated medication list for this problem includes:    Benazepril Hcl 10 Mg Tabs (Benazepril hcl) .[name]... 1 tab by mouth daily  Adult Aspirin Ec Low Strength 81 Mg Tbec (Aspirin) .[name]... 1 tab by mouth daily    Metformin Hcl 500 Mg Tabs (Metformin hcl) .[name]... 1 tab by mouth daily with largest meal  Problem # 4:  URI (ICD-465.9) Residual symptoms--supportive care. Her updated medication list for this problem includes:    Naproxen 500 Mg Tabs (Naproxen) .[name]... 1 tab by mouth two times a day with meals    Adult Aspirin Ec Low Strength 81 Mg Tbec (Aspirin) .[name]... 1 tab by mouth daily    Allegra 180 Mg Tabs (Fexofenadine hcl) .[name]... 1 tablet by mouth daily for allergies  Problem # 5:  DYSLIPIDEMIA  (ICD-272.4) CPM Her updated medication list for this problem includes:    Lipitor 20 Mg Tabs (Atorvastatin calcium) .[name]... 2 tabs at night for cholesterol--dose increase  Problem # 6:  ONYCHOMYCOSIS (ICD-110.1) Resolving nicely--to continue to treat shoes Her updated medication list for this problem includes:    Lamisil 250 Mg Tabs (Terbinafine hcl) .[name]... 1 tab by mouth daily for 6 weeks.  Complete Medication List: 1)  Lipitor 20 Mg Tabs (Atorvastatin calcium) .... 2 tabs at night for cholesterol--dose increase 2)  Benazepril Hcl 10 Mg Tabs (Benazepril hcl) .[name].. 1 tab by mouth daily 3)  Norvasc 5 Mg Tabs (Amlodipine besylate) .[name].. 1 by mouth qam 4)  Hydrochlorothiazide 25 Mg Tabs (Hydrochlorothiazide) .... Take 1 tablet by mouth once a day 5)  Nasacort Aq 55 Mcg/act Aers (Triamcinolone acetonide(nasal)) .... 2squirts qnostril qday 6)  Proventil Hfa 108 (90 Base) Mcg/act Aers (Albuterol sulfate) .... 2 puffs every 6 hours as needed for shortness of breath 7)  Naproxen 500 Mg Tabs (Naproxen) .[name].. 1 tab by mouth two times a day with meals 8)  Protonix 40 Mg Tbec (Pantoprazole sodium) .[name].. 1 tab by mouth daily 9)  Singulair 10 Mg Tabs (Montelukast sodium) .[name].. 1 tab by mouth daily 10)  Adult Aspirin Ec Low Strength 81 Mg Tbec (Aspirin) .[name].. 1 tab by mouth daily 11)  Metformin Hcl 500 Mg Tabs (Metformin hcl) .[name].. 1 tab by mouth daily with largest meal 12)  Allegra 180 Mg Tabs (Fexofenadine hcl) .[name].. 1 tablet by mouth daily for allergies 13)  Advair Diskus 100-50 Mcg/dose Misc (Fluticasone-salmeterol) .[name].. 1 inhalation two times a day 14)  Lamisil 250 Mg Tabs (Terbinafine hcl) .[name].. 1 tab by mouth daily for 6 weeks. 15)  Tramadol Hcl 50 Mg Tabs (Tramadol hcl) .[name].. 1 tab by mouth two times a day --dr. Noel Gerold 16)  Cyclobenzaprine Hcl 10 Mg Tabs (Cyclobenzaprine hcl) .[name].. 1 tab by mouth every 8 hours as needed for muscle spasm--dr. Noel Gerold.  Patient Instructions: 1)  Ask pharmacist for over the  counter guaifenesin and pseudoephedrine or phenylephrine (an expectorant and decongestant)--ask for a 12 hour formulation--see if they have generic.   2)  CPP with Dr. Delrae Alfred in 4 months  Laboratory Results   Blood Tests     HGBA1C: 5.8%   (Normal Range: Non-Diabetic - 3-6%   Control Diabetic - 6-8%)

## 2010-05-07 NOTE — Miscellaneous (Signed)
Summary: PA approved - Singulair 10 mg. x2 years  12/25/09-12/26/11  Clinical Lists Changes  PA approved - Singulair 10 mg. x2 years  12/25/09-12/26/11 Medications: Changed medication from SINGULAIR 10 MG  TABS (MONTELUKAST SODIUM) 1 tab by mouth daily to SINGULAIR 10 MG  TABS (MONTELUKAST SODIUM) 1 tab by mouth daily

## 2010-05-07 NOTE — Miscellaneous (Signed)
Summary: HEMMOCULT CARD RESULTS  Clinical Lists Changes  Observations: Added new observation of HEMOCCULT 2: negative (12/24/2009 10:42) Added new observation of HEMOCCULT 3: negative (12/24/2009 10:42) Added new observation of HEMOCCULT 1: negative (12/24/2009 10:42) Added new observation of LAB RESULTS: QC + x 3 (12/24/2009 10:42)     Laboratory Results    Stool - Occult Blood Hemmoccult #1: negative Date: 12/24/2009 Hemoccult #2: negative Date: 12/24/2009 Hemoccult #3: negative Date: 12/24/2009 Comments: QC + x 3

## 2010-05-07 NOTE — Letter (Signed)
Summary: Lipid Letter  [facility]  [address] [place], [place] [address]   Phone: [phone]  Fax: [phone]    12/11/2009  Pamela Dalton [address] [place], [place]  [address]  Dear Pamela Dalton:  We have carefully reviewed your last lipid profile from 12/07/2009 and the results are noted below with a summary of recommendations for lipid management.    Cholesterol:       180     Goal: <200   HDL "good" Cholesterol:   49     Goal: >45   LDL "bad" Cholesterol:   110     Goal: <70   Triglycerides:       106     Goal: <150    Your labs are all fine, except for you "bad" cholesterol--would like to see that under 70.  No change to meds for now, keep working on diet and exercise.  Your A1C (test for past 3-4 months of sugars) was in the normal range!!!!!!  Terrific sugar control.  Labs for STDs were all negative as well.    TLC Diet (Therapeutic Lifestyle Change): Saturated Fats & Transfatty acids should be kept < 7% of total calories ***Reduce Saturated Fats Polyunstaurated Fat can be up to 10% of total calories Monounsaturated Fat Fat can be up to 20% of total calories Total Fat should be no greater than 25-35% of total calories Carbohydrates should be 50-60% of total calories Protein should be approximately 15% of total calories Fiber should be at least 20-30 grams a day ***Increased fiber may help lower LDL Total Cholesterol should be < 200mg /day Consider adding plant stanol/sterols to diet (example: Benacol spread) ***A higher intake of unsaturated fat may reduce Triglycerides and Increase HDL    Adjunctive Measures (may lower LIPIDS and reduce risk of Heart Attack) include: Aerobic Exercise (20-30 minutes 3-4 times a week) Limit Alcohol Consumption Weight Reduction Aspirin 75-81 mg a day by mouth (if not allergic or contraindicated) Dietary Fiber 20-30 grams a day by mouth     Current Medications: 1)    Lipitor 20 Mg  Tabs (Atorvastatin calcium) .... 2  tabs at night for cholesterol--dose increase 2)    Benazepril Hcl 10 Mg Tabs (Benazepril hcl) .[name].. 1 tab by mouth daily 3)    Norvasc 5 Mg Tabs (Amlodipine besylate) .[name].. 1 by mouth qam 4)    Hydrochlorothiazide 25 Mg Tabs (Hydrochlorothiazide) .... Take 1 tablet by mouth once a day 5)    Nasacort Aq 55 Mcg/act Aers (Triamcinolone acetonide(nasal)) .... 2squirts qnostril qday 6)    Proventil Hfa 108 (90 Base) Mcg/act Aers (Albuterol sulfate) .... 2 puffs every 6 hours as needed for shortness of breath 7)    Naproxen 500 Mg  Tabs (Naproxen) .[name].. 1 tab by mouth two times a day with meals 8)    Protonix 40 Mg  Tbec (Pantoprazole sodium) .[name].. 1 tab by mouth daily 9)    Singulair 10 Mg  Tabs (Montelukast sodium) .[name].. 1 tab by mouth daily 10)    Adult Aspirin Ec Low Strength 81 Mg  Tbec (Aspirin) .[name].. 1 tab by mouth daily 11)    Metformin Hcl 500 Mg Tabs (Metformin hcl) .[name].. 1 tab by mouth daily with largest meal 12)    Allegra 180 Mg Tabs (Fexofenadine hcl) .[name].. 1 tablet by mouth daily for allergies 13)    Advair Diskus 100-50 Mcg/dose Misc (Fluticasone-salmeterol) .[name].. 1 inhalation two times a day 14)    Lamisil 250 Mg Tabs (  Terbinafine hcl) .[name].. 1 tab by mouth daily for 6 weeks. 15)    Tramadol Hcl 50 Mg Tabs (Tramadol hcl) .[name].. 1 tab by mouth two times a day --dr. Noel Gerold 16)    Cyclobenzaprine Hcl 10 Mg Tabs (Cyclobenzaprine hcl) .[name].. 1 tab by mouth every 8 hours as needed for muscle spasm--dr. Noel Gerold. 17)    Metronidazole 500 Mg Tabs (Metronidazole) .[name].. 1 tab by mouth two times a day for 7 days  If you have any questions, please call. We appreciate being able to work with you.   Sincerely,    [facility] Julieanne Manson MD

## 2010-05-07 NOTE — Progress Notes (Signed)
Summary: ACUTE - possible urinary  infection  Phone Note Call from Patient   Reason for Call: Acute Illness Complaint: Urinary/GYN Problems Summary of Call: BURNING FEELING WHEN URINATING WANTS TO KNOW IF THEY CAN CALL SOMETHING IN OR IF SHE NEEDS TO COME IN.[facility] [address] Initial call taken by: Oscar La,  December 25, 2009 8:20 AM  Follow-up for Phone Call        Same burning sensation in the walls of her vagina when voids, has persistent odor of old urine (same as before).  Has not had sex.  Completed the diflucan, but symptoms persist. Follow-up by: Dutch Quint RN,  December 25, 2009 10:02 AM  Additional Follow-up for Phone Call Additional follow up Details #1::        Have her come in for UA and culture if UA abnormal. Additional Follow-up by: Julieanne Manson MD,  December 27, 2009 7:59 AM    Additional Follow-up for Phone Call Additional follow up Details #2::    Left message on answering machine for pt. to return call.  Dutch Quint RN  December 27, 2009 12:39 PM  In office 12/27/09. Dutch Quint RN  December 28, 2009 11:01 AM

## 2010-05-07 NOTE — Letter (Signed)
Summary: [facility]   Imported By: Arta Bruce 10/22/2009 15:07:05  _____________________________________________________________________  External Attachment:    Type:   Image     Comment:   External Document

## 2010-05-07 NOTE — Miscellaneous (Signed)
Summary: 02/01/09 colonoscopy documentation   Clinical Lists Changes  Problems: Changed problem from COLONIC POLYPS (ICD-211.3) to COLONIC POLYPS (ICD-211.3) Added new problem of DIVERTICULOSIS OF COLON (ICD-562.10) Added new problem of HEMORRHOIDS, INTERNAL (ICD-455.0) Observations: Added new observation of COLONOSCOPY: Dr. Myrtie Cruise [facility]:  Multiple recto-sigmoid polyps.  Small internal hemorrhoids, Diverticulosis. (02/01/2009 15:52)

## 2010-05-07 NOTE — Assessment & Plan Note (Signed)
Summary: UTI   Vital Signs:  Patient profile:   60 year old female Menstrual status:  hysterectomy Height:      62.5 inches Weight:      139 pounds BMI:     25.11 Temp:     98.0 degrees F oral Pulse rate:   90 / minute Pulse rhythm:   regular Resp:     18 per minute BP sitting:   110 / 80  (left arm) Cuff size:   regular  Vitals Entered By: [place] Shannon (December 27, 2009 12:08 PM) CC: possible uti.... pt says she just recieved med from [facility].... pt says she still have pressure and it hurts when she urinates.. Is Patient Diabetic? No Pain Assessment Patient in pain? no       Does patient need assistance? Functional Status Self care Ambulation Normal   Primary Care Provider:  Julieanne Manson MD  CC:  possible uti.... pt says she just recieved med from [facility].... pt says she still have pressure and it hurts when she urinates...  History of Present Illness: Had CPP few weeks ago.  Tx for BV.  Notes d/c is now gone.  But, still having dysuria.  Notes frequency and urgency.  Has had some incontinence.  No flank pain.  No fever, chills.  No nausea or vomiting.  Recent u/a abnormal and cx was contaminated.  No allergies.  Problems Prior to Update: 1)  Vaginitis, Bacterial  (ICD-616.10) 2)  Routine Gynecological Examination  (ICD-V72.31) 3)  Urinalysis, Abnormal  (ICD-791.9) 4)  Hemorrhoids, Internal  (ICD-455.0) 5)  Diverticulosis of Colon  (ICD-562.10) 6)  Uri  (ICD-465.9) 7)  Colonic Polyps  (ICD-211.3) 8)  Glaucoma, Possible  (ICD-365.9) 9)  Allergic Asthma  (ICD-493.00) 10)  Pelvic Pain, Left  (ICD-789.09) 11)  Onychomycosis  (ICD-110.1) 12)  Leg Cramps  (ICD-729.82) 13)  Plantar Fasciitis, Bilateral  (ICD-728.71) 14)  Health Maintenance Exam  (ICD-V70.0) 15)  Diabetes Mellitus, Type II, Controlled  (ICD-250.00) 16)  Peripheral Edema  (ICD-782.3) 17)  Obesity  (ICD-278.00) 18)  Sinusitis  (ICD-473.9) 19)  Gerd  (ICD-530.81) 20)  Peripheral Neuropathy   (ICD-356.9) 21)  Allergic Rhinitis  (ICD-477.9) 22)  Encounter For Long-term Use of Other Medications  (ICD-V58.69) 23)  Carpal Tunnel Syndrome, 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 1 Tab By Mouth Daily With Meal 2)  Benazepril Hcl 10 Mg Tabs (Benazepril Hcl) .[name].. 1 Tab By Mouth Daily 3)  Norvasc 5 Mg Tabs (Amlodipine Besylate) .[name].. 1 By Mouth Qam 4)  Hydrochlorothiazide 25 Mg Tabs (Hydrochlorothiazide) .... Take 1 Tablet By Mouth Once A Day 5)  Nasacort Aq 55 Mcg/act Aers (Triamcinolone Acetonide(Nasal)) .... 2squirts Qnostril Qday 6)  Proventil Hfa 108 (90 Base) Mcg/act Aers (Albuterol Sulfate) .... 2 Puffs Every 6 Hours As Needed For Shortness of Breath 7)  Naproxen 500 Mg  Tabs (Naproxen) .[name].. 1 Tab By Mouth Two Times A Day With Meals 8)  Protonix 40 Mg  Tbec (Pantoprazole Sodium) .[name].. 1 Tab By Mouth Daily 9)  Singulair 10 Mg  Tabs (Montelukast Sodium) .Marland KitchenMarland KitchenMarland Kitchen  1 Tab By Mouth Daily 10)  Adult Aspirin Ec Low Strength 81 Mg  Tbec (Aspirin) .[name].. 1 Tab By Mouth Daily 11)  Metformin Hcl 500 Mg Tabs (Metformin Hcl) .[name].. 1 Tab By Mouth Daily With Largest Meal 12)  Allegra 180 Mg Tabs (Fexofenadine Hcl) .[name].. 1 Tablet By Mouth Daily For Allergies 13)  Advair Diskus 100-50 Mcg/dose Misc (Fluticasone-Salmeterol) .[name].. 1 Inhalation Two Times A Day 14)  Lamisil 250 Mg Tabs (Terbinafine Hcl) .[name].. 1 Tab By Mouth Daily For 6 Weeks. 15)  Tramadol Hcl 50 Mg Tabs (Tramadol Hcl) .[name].. 1 Tab By Mouth Two Times A Day --Dr. Noel Gerold 16)   Cyclobenzaprine Hcl 10 Mg Tabs (Cyclobenzaprine Hcl) .[name].. 1 Tab By Mouth Every 8 Hours As Needed For Muscle Spasm--Dr. Noel Gerold. 17)  Metronidazole 500 Mg Tabs (Metronidazole) .[name].. 1 Tab By Mouth Two Times A Day For 7 Days  Allergies (verified): 1)  Codeine  Physical Exam  General:  alert, well-developed, well-nourished, and well-hydrated.   Head:  normocephalic and atraumatic.   Neck:  supple.   Lungs:  normal breath sounds.   Heart:  normal rate and regular rhythm.   Abdomen:  soft and non-tender.   Msk:  no CVA tend to perc bilat Extremities:  no edema  Neurologic:  alert & oriented X3 and cranial nerves II-XII intact.   Psych:  normally interactive.     Impression & Recommendations:  Problem # 1:  UTI (ICD-599.0)  symptoms suggestive of UTI u/a abnormal sounds like symptoms of BV resolved will tx for UTI culture urine repeat u/a in 2 weeks f/u if no improvement  The following medications were removed from the medication list:    Metronidazole 500 Mg Tabs (Metronidazole) .[name]... 1 tab by mouth two times a day for 7 days Her updated medication list for this problem includes:    Macrobid 100 Mg Caps (Nitrofurantoin monohyd macro) .[name]... Take 1 capsule by mouth two times a day  Orders: UA Dipstick w/o Micro (manual) ([address]) T-Culture, Urine ([id number])  Complete Medication List: 1)  Pravastatin Sodium 80 Mg Tabs (Pravastatin sodium) .[name].. 1 tab by mouth daily with meal 2)  Benazepril Hcl 10 Mg Tabs (Benazepril hcl) .[name].. 1 tab by mouth daily 3)  Norvasc 5 Mg Tabs (Amlodipine besylate) .[name].. 1 by mouth qam 4)  Hydrochlorothiazide 25 Mg Tabs (Hydrochlorothiazide) .... Take 1 tablet by mouth once a day 5)  Nasacort Aq 55 Mcg/act Aers (Triamcinolone acetonide(nasal)) .... 2squirts qnostril qday 6)  Proventil Hfa 108 (90 Base) Mcg/act Aers (Albuterol sulfate) .... 2 puffs every 6 hours as needed for shortness of breath 7)  Naproxen 500 Mg Tabs (Naproxen) .[name].. 1 tab by mouth two  times a day with meals 8)  Protonix 40 Mg Tbec (Pantoprazole sodium) .[name].. 1 tab by mouth daily 9)  Singulair 10 Mg Tabs (Montelukast sodium) .[name].. 1 tab by mouth daily 10)  Adult Aspirin Ec Low Strength 81 Mg Tbec (Aspirin) .[name].. 1 tab by mouth daily 11)  Metformin Hcl 500 Mg Tabs (Metformin hcl) .[name].. 1 tab by mouth daily with largest meal 12)  Allegra 180 Mg Tabs (Fexofenadine hcl) .[name].. 1 tablet by mouth daily for allergies 13)  Advair Diskus 100-50 Mcg/dose Misc (Fluticasone-salmeterol) .[name].. 1 inhalation two times a day 14)  Lamisil 250 Mg Tabs (Terbinafine hcl) .[name].. 1 tab by mouth daily for 6 weeks. 15)  Tramadol Hcl 50 Mg Tabs (Tramadol hcl) .[name].. 1 tab by mouth two times a day --dr. Noel Gerold 16)  Cyclobenzaprine Hcl 10 Mg Tabs (Cyclobenzaprine hcl) .[name].. 1 tab by mouth every 8 hours as needed for muscle spasm--dr. Noel Gerold. 17)  Macrobid 100 Mg Caps (Nitrofurantoin monohyd macro) .... Take 1 capsule by mouth two times a day  Patient Instructions: 1)  Prescription for Macrobid sent to [facility]. pharmacy. 2)  Take this until all gone. 3)  Return for repeat u/a in 2 weeks (lab visit). 4)  Return for appt if symptoms no better or worse. Prescriptions: MACROBID 100 MG CAPS (NITROFURANTOIN MONOHYD MACRO) Take 1 capsule by mouth two times a day  #14 x 0   Entered and Authorized by:   Tereso Newcomer PA-C   Signed by:   Tereso Newcomer PA-C on 12/27/2009   Method used:   Print then Give to Patient   RxID:   [id number]   Laboratory Results   Urine Tests  Date/Time Received: December 27, 2009 12:27 PM   Routine Urinalysis   Glucose: negative   (Normal Range: Negative) Bilirubin: small   (Normal Range: Negative) Ketone: negative   (Normal Range: Negative) Spec. Gravity: >=1.030   (Normal Range: 1.003-1.035) Blood: trace-lysed   (Normal Range: Negative) pH: 5.0   (Normal Range: 5.0-8.0) Protein: 30   (Normal Range: Negative) Urobilinogen: 0.2   (Normal Range: 0-1) Nitrite: negative    (Normal Range: Negative) Leukocyte Esterace: trace   (Normal Range: Negative)

## 2010-05-07 NOTE — Letter (Signed)
Summary: [employer]/P/A APPROVED//SINGULAR  [employer]/P/A APPROVED//SINGULAR   Imported By: Arta Bruce 01/01/2010 15:36:20  _____________________________________________________________________  External Attachment:    Type:   Image     Comment:   External Document

## 2010-05-07 NOTE — Progress Notes (Signed)
Summary: Office Visit//DEPRESSION SCREENING  Office Visit//DEPRESSION SCREENING   Imported By: Arta Bruce 12/13/2009 15:58:48  _____________________________________________________________________  External Attachment:    Type:   Image     Comment:   External Document

## 2010-05-09 NOTE — Letter (Signed)
Summary: *HSN Results Follow up  [facility]  [address] [place], [place] [address]   Phone: [phone]  Fax: [phone]      03/26/2010   Pamela Dalton [address] [place], [place]  [address]   Dear  [name],                            ____S.Drinkard,FNP   ____D. Gore,FNP       ____B. McPherson,MD   ____V. Rankins,MD    __X__E. [facility],MD    ____N. Daphine Deutscher, FNP  ____D. Reche Dixon, MD    ____K. Philipp Deputy, MD    ____Other     This letter is to inform you that your recent test(s):  _______Pap Smear    ___X____Lab Test     _______X-ray    __X_____ is within acceptable limits  _______ requires a medication change  _______ requires a follow-up lab visit  _______ requires a follow-up visit with your Mariella Blackwelder   Comments:  Repeat calcium and parathyroid hormone were okay.  Have a good Christmas!       _________________________________________________________ If you have any questions, please contact our office                     Sincerely,  Julieanne Manson MD [facility]

## 2010-05-09 NOTE — Progress Notes (Signed)
Summary: BP IS LOW  Phone Note Call from Patient Call back at [name] Phone [phone]   Summary of Call: Fadumo Heng PT. MS GAINEY CALLED BECASUE SHE SAW HER BACK DR ON MONDAY AND HER BP IN HIS OFFICE WAS 90/? SHE CAN'T REMEMBER, BUT SHE TOLD THE NURSE SHE STAYS TIRED, SLEEPY, WEAK AND HER BREATH SEEMS SHOT Initial call taken by: Leodis Rains,  May 01, 2010 9:33 AM  Follow-up for Phone Call        Last seen 12/2009.  Left message on answering machine for pt. to return call.  Dutch Quint RN  May 01, 2010 4:52 PM  States BP was 90/60.  Last 2-3 days has no energy, sometimes SOB, using inhalers.  Checked BP yesterday 129/80.   States BP has been going up and down for no reason.  States she is eating well, getting rest -- has been feeling like she's "going to fall out."  Not sure what's going on -- wants to see provider.  Appt. scheduled 05/07/10. Follow-up by: Dutch Quint RN,  May 02, 2010 5:39 PM

## 2010-05-15 NOTE — Assessment & Plan Note (Signed)
Summary: OV    Vital Signs:  Patient profile:   60 year old female Menstrual status:  hysterectomy Weight:      146.2 pounds BSA:     1.68 Temp:     97.5 degrees F oral Pulse rate:   88 / minute Pulse rhythm:   regular Resp:     18 per minute BP sitting:   120 / 84  (left arm) Cuff size:   regular  Vitals Entered By: Gaylyn Cheers RN (May 07, 2010 1:22 PM) CC: Last Mon. saw Dr. Noel Gerold BP was 90/60 had been feeling lightheaded, Wed. 129/80, Nasal D/C yellow/green lg amt. sore throat, chest congestion, HA, chills, ears painful, facial swelling for past 3 days Is Patient Diabetic? Yes Did you bring your meter with you today? No Pain Assessment Patient in pain? yes     Location: head Intensity: 6 Type: heaviness Onset of pain  Constant CBG Result 92 CBG Device ID B  Does patient need assistance? Functional Status Self care Ambulation Normal   Primary Care Provider:  Julieanne Manson MD  CC:  Last Mon. saw Dr. Noel Gerold BP was 90/60 had been feeling lightheaded, Wed. 129/80, Nasal D/C yellow/green lg amt. sore throat, chest congestion, HA, chills, ears painful, and facial swelling for past 3 days.  History of Present Illness: As above. Was fatigued all last week.  5 days ago, develped cough productive of clear mucous.  Has chest tightness and feels dypsneic.  Head very congested.  Lot of posterior pharyngeal drainage.  Temp up to 100.  +sore throat,  HA, nausea, stomachache.  No diarrhea.  Sugars are doing just fine--low 100s.  Using Advair two times a day, needing rescue inhaler two times a day, Using Singulair and nasal corticosteroids once daily.  Not on any antihistamine.  Current Medications (verified): 1)  Pravastatin Sodium 80 Mg Tabs (Pravastatin Sodium) .[name].. 1 Tab By Mouth Daily With Meal 2)  Benazepril Hcl 10 Mg Tabs (Benazepril Hcl) .[name].. 1 Tab By Mouth Daily 3)  Norvasc 5 Mg Tabs (Amlodipine Besylate) .[name].. 1 By Mouth Qam 4)  Hydrochlorothiazide 25 Mg Tabs  (Hydrochlorothiazide) .... Take 1 Tablet By Mouth Once A Day 5)  Fluticasone Propionate 50 Mcg/act Susp (Fluticasone Propionate) .... 2 Sprays Each Nostril Daily 6)  Proventil Hfa 108 (90 Base) Mcg/act Aers (Albuterol Sulfate) .... 2 Puffs Every 6 Hours As Needed For Shortness of Breath 7)  Naproxen 500 Mg  Tabs (Naproxen) .[name].. 1 Tab By Mouth Two Times A Day With Meals 8)  Protonix 40 Mg  Tbec (Pantoprazole Sodium) .[name].. 1 Tab By Mouth Daily 9)  Singulair 10 Mg  Tabs (Montelukast Sodium) .[name].. 1 Tab By Mouth Daily 10)  Adult Aspirin Ec Low Strength 81 Mg  Tbec (Aspirin) .[name].. 1 Tab By Mouth Daily 11)  Metformin Hcl 500 Mg Tabs (Metformin Hcl) .[name].. 1 Tab By Mouth Daily With Largest Meal 12)  Allegra 180 Mg Tabs (Fexofenadine Hcl) .[name].. 1 Tablet By Mouth Daily For Allergies 13)  Advair Diskus 100-50 Mcg/dose Misc (Fluticasone-Salmeterol) .[name].. 1 Inhalation Two Times A Day 14)  Lamisil 250 Mg Tabs (Terbinafine Hcl) .[name].. 1 Tab By Mouth Daily For 6 Weeks. 15)  Tramadol Hcl 50 Mg Tabs (Tramadol Hcl) .[name].. 1 Tab By Mouth Two Times A Day --Dr. Noel Gerold 16)  Cyclobenzaprine Hcl 10 Mg Tabs (Cyclobenzaprine Hcl) .[name].. 1 Tab By Mouth Every 8 Hours As Needed For Muscle Spasm--Dr. Noel Gerold. 17)  Clindamycin Phosphate 2 % Crea (Clindamycin Phosphate) .Marland KitchenMarland KitchenMarland Kitchen 1  Applicatorful Intravaginally At Bedtime For 7 Days  Allergies (verified): 1)  Codeine  Physical Exam  General:  Deep, congested cough Head:  Tender over frontal and maxillary sinuses Ears:  Retracted but clear right TM.  Normal on left Nose:  clear discharge Mouth:  pharynx pink and moist.   Neck:  No deformities, masses, or tenderness noted. Lungs:  Scattered exp wheeze.  No crackles.  Good air movement Heart:  Normal rate and regular rhythm. S1 and S2 normal without gallop, murmur, click, rub or other extra sounds. Abdomen:  soft and non-tender.     Impression & Recommendations:  Problem # 1:  SINUSITIS, ACUTE (ICD-461.9) perhaps only viral, but concerned  for a secondary bacterial pneumonia. Her updated medication list for this problem includes:    Fluticasone Propionate 50 Mcg/act Susp (Fluticasone propionate) .[name]... 2 sprays each nostril daily    Azithromycin 250 Mg Tabs (Azithromycin) .[name]... 2 tabs by mouth today, then 1 tab by mouth daily for 4 more days  Problem # 2:  BRONCHITIS, ACUTE (ICD-466.0) with asthma exacerbation Added sample of Advair diskus 250 micrograms two times a day with current Advair for next week. Her updated medication list for this problem includes:    Proventil Hfa 108 (90 Base) Mcg/act Aers (Albuterol sulfate) .[name]... 2 puffs every 6 hours as needed for shortness of breath    Singulair 10 Mg Tabs (Montelukast sodium) .[name]... 1 tab by mouth daily    Advair Diskus 100-50 Mcg/dose Misc (Fluticasone-salmeterol) .[name]... 1 inhalation two times a day    Azithromycin 250 Mg Tabs (Azithromycin) .[name]... 2 tabs by mouth today, then 1 tab by mouth daily for 4 more days  Complete Medication List: 1)  Pravastatin Sodium 80 Mg Tabs (Pravastatin sodium) .[name].. 1 tab by mouth daily with meal 2)  Benazepril Hcl 10 Mg Tabs (Benazepril hcl) .[name].. 1 tab by mouth daily 3)  Norvasc 5 Mg Tabs (Amlodipine besylate) .[name].. 1 by mouth qam 4)  Hydrochlorothiazide 25 Mg Tabs (Hydrochlorothiazide) .... Take 1 tablet by mouth once a day 5)  Fluticasone Propionate 50 Mcg/act Susp (Fluticasone propionate) .... 2 sprays each nostril daily 6)  Proventil Hfa 108 (90 Base) Mcg/act Aers (Albuterol sulfate) .... 2 puffs every 6 hours as needed for shortness of breath 7)  Naproxen 500 Mg Tabs (Naproxen) .[name].. 1 tab by mouth two times a day with meals 8)  Protonix 40 Mg Tbec (Pantoprazole sodium) .[name].. 1 tab by mouth daily 9)  Singulair 10 Mg Tabs (Montelukast sodium) .[name].. 1 tab by mouth daily 10)  Adult Aspirin Ec Low Strength 81 Mg Tbec (Aspirin) .[name].. 1 tab by mouth daily 11)  Metformin Hcl 500 Mg Tabs (Metformin hcl) .[name].. 1 tab by mouth daily with largest meal 12)   Advair Diskus 100-50 Mcg/dose Misc (Fluticasone-salmeterol) .[name].. 1 inhalation two times a day 13)  Lamisil 250 Mg Tabs (Terbinafine hcl) .[name].. 1 tab by mouth daily for 6 weeks. 14)  Tramadol Hcl 50 Mg Tabs (Tramadol hcl) .[name].. 1 tab by mouth two times a day --dr. Noel Gerold 15)  Cyclobenzaprine Hcl 10 Mg Tabs (Cyclobenzaprine hcl) .[name].. 1 tab by mouth every 8 hours as needed for muscle spasm--dr. Noel Gerold. 16)  Clindamycin Phosphate 2 % Crea (Clindamycin phosphate) .[name].. 1 applicatorful intravaginally at bedtime for 7 days 17)  Loratadine 10 Mg Tabs (Loratadine) .[name].. 1 tab by mouth daily for allergies 18)  Azithromycin 250 Mg Tabs (Azithromycin) .... 2 tabs by mouth today, then 1 tab by mouth daily for  4 more days 19)  Advair Diskus 250 Mcg  .... Use with current advair two times a day  Other Orders: Capillary Blood Glucose/CBG (82948) Hemoglobin A1C (54098)  Patient Instructions: 1)  Call if not improving in about 48 hours or if worsen at any time 2)  Push fluids 3)  Use rescue inhaler every 4-6 hours  Prescriptions: ADVAIR DISKUS 250 MCG Use with current Advair two times a day  #1 samp x 0   Entered and Authorized by:   Julieanne Manson MD   Signed by:   Julieanne Manson MD on 05/07/2010   Method used:   Samples Given   RxID:   [id number] AZITHROMYCIN 250 MG TABS (AZITHROMYCIN) 2 tabs by mouth today, then 1 tab by mouth daily for 4 more days  #1 pack x 0   Entered and Authorized by:   Julieanne Manson MD   Signed by:   Julieanne Manson MD on 05/07/2010   Method used:   Faxed to ...       [facility] (retail)       [address] [place], [place]  [address]       Ph: [phone]       Fax: [phone]   RxID:   [id number] LORATADINE 10 MG TABS (LORATADINE) 1 tab by mouth daily for allergies  #30 x 11   Entered and Authorized by:   Julieanne Manson MD   Signed by:   Julieanne Manson MD on 05/07/2010   Method used:   Faxed  to ...       [facility] (retail)       [address] [place], [place]  [address]       Ph: [phone]       Fax: [phone]   RxID:   [id number]    Orders Added: 1)  Capillary Blood Glucose/CBG [82948] 2)  Hemoglobin A1C [83036] 3)  Est. Patient Level III [99213]    Laboratory Results   Blood Tests   Date/Time Received: May 07, 2010 1:40 PM   HGBA1C: 5.5%   (Normal Range: Non-Diabetic - 3-6%   Control Diabetic - 6-8%) CBG Random:: 92

## 2010-08-20 NOTE — Op Note (Signed)
Pamela Dalton, Pamela Dalton NO.:  [id number]   MEDICAL RECORD NO.:  [id number]          PATIENT TYPE:  OIB   LOCATION:  5707                         FACILITY:  [facility]   PHYSICIAN:  Cherylynn Ridges, M.D.    DATE OF BIRTH:  Dec 07, 1950   DATE OF PROCEDURE:  03/17/2007  DATE OF DISCHARGE:                               OPERATIVE REPORT   PREOPERATIVE DIAGNOSIS:  Primary hyperparathyroidism.   POSTOPERATIVE DIAGNOSIS:  Primary hyperparathyroidism, but without  hyperplasia for primary parathyroid adenoma.   PROCEDURE:  Neck exploration with partial right upper thyroid lobectomy  and removal of lymphoid tissue.   SURGEON:  Cherylynn Ridges, M.D.   ASSISTANT:  Lennie Muckle, MD   ANESTHESIA:  General endotracheal.   ESTIMATED BLOOD LOSS:  Less than 20 mL.   There were no complications.  Condition stable.   FINDINGS:  Four samples of tissue was taken, two from the right inferior  aspect where the patient was noted to have possible increased uptake on  sestamibi scan.  Both identified as lymphoid tissue.  We clipped what  was thought to be parathyroid tissue along the area.  The right upper  lobe of the thyroid was also taken, partial right upper lobe of the  thyroid gland because of a nodule palpated there next to what was  clipped as the parathyroid tissue.  An inferior left parathyroid tissue,  what was thought to be parathyroid tissue was taken, turned out to be  adipose tissue.  Condition is stable.   INDICATIONS FOR OPERATION:  The patient is a 56-year female with primary  hyperparathyroidism noted with increased parathyroid hormone level and a  calcium of 11, minimally symptomatic, comes in for exploration.   OPERATION:  The patient was taken to the operating room, placed on table  in supine position.  After an adequate endotracheal anesthetic was  administered, a roll was placed underneath the shoulder.  Her neck was  prepped and draped in usual sterile manner in  extension.   We marked the area of incision with a 0 silk and the midline was marked  with a marking pen.  We made a transverse incision using #15 blade  approximately 6 to 6.5 cm long down into subcutaneous tissue then  through platysmas muscle using electrocautery.  We dissected superior  and inferior flaps and subsequently dissected down through the midline  strap muscles using electrocautery.  Once we were down through the  sternal hyoid muscle fascial layer we used a Green retractor primarily  on the right side to lift up the strap muscles as we palpated on the  thyroid gland and dissected inferolaterally and superior laterally.  In  the inferior lateral aspect of the thyroid gland was some tissue which  was felt to perhaps represent enlarged parathyroid tissue but no actual  adenoma was noted.  We dissected this tissue away and submitted it as a  specimen for frozen section and it came back lymphoid tissue.  Care was  taken not to injure or identify the recurrent laryngeal nerve on either  side.   The middle  thyroid vein was taken with clips and transected.  We noted  on upper pole of the right thyroid area there was a nodule in the  thyroid gland itself which we dissected free and after taking part of  the superior pole vessels, we took part of the thyroid gland and sent  that as a specimen also.  The second piece of lymphoid tissue what was  thought to perhaps represent parathyroid tissue and inferior lateral  aspect on the right side which was sent as a fourth specimen, the third  specimen being from the inferior left side of the neck and the thyroid  gland.  All four quadrants of thyroid gland were explored and no  abnormal adenoma was noted.  We did palpate into the superior  mediastinum but did not sharply dissect into that area not wanting to  get into the patient's cupola and perhaps starting a pneumothorax.  We  palpated in that area, did not palpate any abnormal  tissue.   It was decided based on even though we did not identify any abnormal  parathyroid tissue on pathologic examination by frozen exam decided not  to further explore as the patient only has mild primary  hyperparathyroidism and that further localization may be necessary if  she developed further problems or symptoms.  We will follow her calcium  postoperatively.  Her immediate preop calcium was only 10.5 which was at  the upper limit of normal.  After we had done four quadrant exploration  and removed the upper part of the right upper lobe of thyroid gland, we  obtained hemostasis using some electrocautery.  We identify the  recurrent laryngeal nerve on the right side and was safe and away from  our area of dissection.  We did not clipped in those areas or cauterize.  We closed in several layers.  The strap muscles were reapproximated in  the midline using interrupted 4-0 Vicryl sutures.  The platysma muscle  was reapproximated using interrupted 4-0 Vicryl sutures and the skin was  closed using interrupted 5-0 nylon sutures.  Steri-Strips were applied  in between the sutures, part of which will be removed tomorrow.  Antibiotic Povidone ointment was placed on the wound and a sterile  dressing applied.  All counts were correct including needles, sponges  and instruments.      Cherylynn Ridges, M.D.  Electronically Signed     JOW/MEDQ  D:  03/17/2007  T:  03/18/2007  Job:  [id number]

## 2010-08-23 NOTE — Op Note (Signed)
Pamela Dalton, Pamela Dalton NO.:  [id number]   MEDICAL RECORD NO.:  [id number]          PATIENT TYPE:  AMB   LOCATION:  SDS                          FACILITY:  [facility]   PHYSICIAN:  Sharolyn Douglas, M.D.        DATE OF BIRTH:  26-Jan-1951   DATE OF PROCEDURE:  02/27/2006  DATE OF DISCHARGE:                               OPERATIVE REPORT   DIAGNOSES:  Lumbar spondylosis and degenerative disk disease.   PROCEDURE:  1. Bilateral L4-5 facette joint injections.  2. Right L4-5 transforaminal epidural steroid injection.  3. Fluoroscopic imaging used for needle placement of the above      injections.   SURGEON:  Sharolyn Douglas, MD   ASSISTANT:  None.   ANESTHESIA:  MAC plus local.   ESTIMATED BLOOD LOSS:  None.   COMPLICATIONS:  None.   INDICATIONS:  The patient is a pleasant 60 year old female with  persistent back and right greater than left lower extremity pain.  She  has failed other conservative treatment and now elects to proceed with  diagnostic and potentially therapeutic injections.  Risks, benefits, and  alternatives were reviewed.   PROCEDURE:  After informed consent, she was taken to the operating room.  She was turned prone.  She underwent sedation by anesthesia.  Back  prepped, draped usual sterile fashion.  Fluoroscopy was brought into the  field, and bilateral L4-5 facette joint injections were performed from a  posterolateral approach.  We utilized 22 gauge Quincke spinal needles.  Each facette joint at L4-5 was injected with a solution of 40 mg Depo-  Medrol and 1 mL preservative-free lidocaine.  We then turned our  attention to performing a transforaminal epidural steroid injection at  L4-5 on the right side.  Using oblique imaging, a 22 gauge Quincke  spinal needle was placed under the 6 o'clock position of the right L4  pedicle; aspiration showed no blood.  A small amount of Omnipaque was  injected which showed some epidural spread.  We did have to  reposition  the needle in order to get the epidural spread.  We then injected a  solution of 80 mg Depo-Medrol and 4 mL preservative-free lidocaine.  The  patient tolerated the procedure well, no apparent complications,  transferred to recovery in stable condition neurologically intact.  I  reviewed postinjection instructions with her.  She will follow up in 2  weeks.     Sharolyn Douglas, M.D.  Electronically Signed    MC/MEDQ  D:  02/27/2006  T:  02/27/2006  Job:  [id number]

## 2010-08-23 NOTE — Op Note (Signed)
Pamela Dalton, BRADDY NO.:  [id number]   MEDICAL RECORD NO.:  [id number]          PATIENT TYPE:  INP   LOCATION:  5021                         FACILITY:  [facility]   PHYSICIAN:  Sharolyn Douglas, M.D.        DATE OF BIRTH:  1950/04/25   DATE OF PROCEDURE:  05/20/2006  DATE OF DISCHARGE:  05/23/2006                               OPERATIVE REPORT   DIAGNOSES:  1. 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  2. Lumbar spinal stenosis.  3. Chronic back and bilateral lower extremity pain.   PROCEDURES:  1. Lumbar laminectomy L3-4 and L4-5 with wide decompression of the      thecal sac and nerve roots bilaterally.  2. Posterior lumbar fusion L4-5.  3. Transforaminal lumbar interbody fusion L4-5 with placement of PEEK      cage.  4. Pedicle screw instrumentation L4-5 using the Abbott spine system.  5. Local autogenous bone graft supplemented with bone morphogenic      protein.   SURGEON:  Sharolyn Douglas, MD.   Has  [facility], [place]   ANESTHESIA:  General endotracheal.   COMPLICATIONS:  None.   SPONGE AND NEEDLE COUNT:  Correct.   INDICATIONS:  The patient is a pleasant 60 year old female with a long  history of back and left greater than right lower extremity pain.  Her  MRI scan shows degenerative spondylolisthesis at L4-5 with spinal  stenosis.  She has failed all attempts at other conservative treatment  modalities and now elects to proceed with lumbar decompression and  fusion.  The risks, benefits, and alternatives were reviewed.   PROCEDURE:  After informed consent she was taken to the operating room.  She underwent general endotracheal anesthesia without difficulty and  given prophylactic IV antibiotics.  Neuro monitoring was established in  the form of SSEPs and EMGs.  She was carefully turned prone onto the  Wilson frame.  All bony prominences padded.  Face abstracted at all  times.  Back prepped, draped in the usual sterile fashion.  A midline  incision was  made from L3 down to L5.  Dissection was carried through  the deep fascia.  Subperiosteal exposure carried out over the facette  joints to the tips of the transverse processes of L4-L5.  The spine was  degenerative and the facette joints were very hypertrophied.  The  osteophytes were debrided from the L4-5 joint.  Intraoperative x-ray was  taken to confirm the level.  Deep retractor placed.   We then completed a wide laminectomy by removing the entire spinous  process and lamina of L3 and L4.  We found severe spinal stenosis  secondary to posterior element hypertrophy and ligamentum flavum  enlargement.  The lateral recesses were decompressed flush with the  pedicles.  We identified the L4 and L5 nerve roots and confirmed that  there were widely decompressed.  We then turned our attention to placing  pedicle screws at L4 and L5.  This was accomplished using anatomic  probing technique.  The pedicles could also be palpated from within the  spinal canal.  We utilized 6.5 mm pedicle  screws.  We had good screw  purchase.  Each screw was stimulated with triggered EMGs and there were  no deleterious changes.  We decorticated the transverse processes of L4  and L5 before placing the pedicle screws using a high-speed bur.  We  completed the posterior spinal fusion by packing the local bone graft  obtained from the laminectomy into the lateral gutters between L4-L5.   At this point we elected to proceed with a transforaminal lumbar  interbody fusion at L4-5 in order to further decompress the foramen and  also improve the fusion rate.  The facette joint was osteotomized on the  left side.  The exiting and transversing nerve roots were identified and  protected at all times.  Free running EMGs were monitored.  The disk  space was entered and dilated up to 11 mm.  The cartilaginous endplates  were scraped clean.  The disk space was then packed with local bone  graft along with BMP sponges.  An  11-mm PEEK cage was then inserted into  the interspace tamped anteriorly and across the midline.  There were no  deleterious changes in the free running EMGs.  We then placed the  titanium rods into the polyaxial screw heads and applied compression  before shearing off the locking caps.  Hemostasis was achieved.  A deep  Hemovac drain was left.  The deep fascia closed with a running 4-0  Vicryl suture.  The subcutaneous layer closed with O Vicryl and 2-0  Vicryl followed by 3-0 subcuticular Vicryl on the skin edges.  Dermabond  was applied.  Sterile dressing placed.  The patient was turned supine  and extubated without difficulty and transferred to recovery in stable  condition able to move her upper and lower extremities.   It should be noted my assistant, Verlin Fester, PA was present  throughout procedure including during the positioning, the exposure, the  decompression, the instrumentation, the fusion and she also assisted  with wound closure.      Sharolyn Douglas, M.D.  Electronically Signed     MC/MEDQ  D:  05/28/2006  T:  05/28/2006  Job:  [id number]

## 2010-08-23 NOTE — Op Note (Signed)
NAMESHY, Pamela Dalton NO.:  [id number]   MEDICAL RECORD NO.:  [id number]          PATIENT TYPE:  AMB   LOCATION:  [facility]                         FACILITY:  [facility]   PHYSICIAN:  Sharolyn Douglas, M.D.        DATE OF BIRTH:  07-29-1950   DATE OF PROCEDURE:  08/21/2005  DATE OF DISCHARGE:                                 OPERATIVE REPORT   DIAGNOSIS:  Lumbar spinal stenosis and degenerative spondylolisthesis.   PROCEDURE:  L5-S1 epidural steroid injection.   SURGEON:  Sharolyn Douglas, M.D.   ASSISTANT:  None.   ANESTHESIA:  MAC plus local.   COMPLICATIONS:  None.   ESTIMATED BLOOD LOSS:  None.   INDICATIONS:  The patient is a pleasant 60 year old female with lumbar  spinal stenosis and underlying degenerative spondylolisthesis.  She has  failed to respond to conservative treatment and now elects to undergo  epidural steroid injection in hopes of improving her symptoms.  Risks and  benefits reviewed.   PROCEDURE:  She was identified in the holding area and taken to the  operating room and underwent sedation by the anesthesia department, turned  prone.  Back prepped and draped in the usual sterile fashion.  Fluoroscopy  used to visualize the L5-S1 interspace.  A 22-gauge Tuohy needle used to  advance toward the interlaminar space.  Lateral fluoroscopy then used along  with the loss of resistance technique to enter the epidural space.  Aspiration was performed.  There was no blood or CSF.  Depo-Medrol 80 mg and  5 mL of preservative-free lidocaine injected into the epidural space.  The  Tuohy needle removed, Band-Aid placed.  The patient was turned supine,  transferred to the recovery room in stable condition.  She will follow up in  two weeks.  Neurologically she has good strength and sensation post  injection.  If she has any increasing fevers or pain, she will call the  office.  Fluoroscopic images were saved and brought to the office.      Sharolyn Douglas, M.D.  Electronically Signed     MC/MEDQ  D:  08/21/2005  T:  08/22/2005  Job:  [id number]

## 2010-08-23 NOTE — H&P (Signed)
NAME:  Pamela Dalton, Pamela Dalton NO.:  [id number]   MEDICAL RECORD NO.:  [id number]          PATIENT TYPE:  INP   LOCATION:  NA                           FACILITY:  [facility]   PHYSICIAN:  Verlin Fester, P.A.    DATE OF BIRTH:  07/14/1950   DATE OF ADMISSION:  05/20/2006  DATE OF DISCHARGE:                              HISTORY & PHYSICAL   HISTORY:  The patient is a 60 year old female with a long history of low  back and left lower extremity pain.  She has tried numerous types of  conservative treatment and has failed.  It is felt that her best course  of management because of her x-ray and MRI findings of degenerative  spondylolisthesis as well as spinal stenosis would be surgical  intervention.  The risks and benefits of L3 to L5 laminectomy and L4-5  fusion were discussed with the patient at length by Dr. Noel Gerold as well as  myself.  She indicated understanding and opts to proceed.   ALLERGIES:  CODEINE.   MEDICATIONS:  Vytorin, Zyrtec, Norvasc, hydrochlorothiazide, Benazepril,  Premarin, Vesicare, hydrocodone, Tramadol, Risperdal, and clonazepam.   PAST MEDICAL HISTORY:  Hypertension.   PAST SURGICAL HISTORY:  Hysterectomy and left knee surgery.   SOCIAL HISTORY:  The patient denies tobacco use.  Denies alcohol use.  She is married.  Works as a Soil scientist.  Currently not working because  of her pain.  She has two children.  Her family will be available to  help her postoperatively.   FAMILY MEDICAL HISTORY:  Noncontributory.   REVIEW OF SYSTEMS:  Negative.   PHYSICAL EXAMINATION:  The patient is a 60 year old female who is alert  and oriented, in no acute distress.  She is well nourished, appears  stated age, pleasant and cooperative to exam.  VITAL SIGNS:  Blood pressure is 120/80.  GENERAL APPEARANCE:  A 60 year old black female who is alert and  oriented, in no acute distress, well nourished, well groomed, appears  stated age.  Pleasant and cooperative to the  examination.  HEENT:  Head is normocephalic, atraumatic.  Pupils are equal, round, and  reactive.  Extraocular movements intact.  Nares patent.  Pharynx clear.  NECK:  Supple to palpation.  No lymphadenopathy, thyromegaly or bruits  appreciated.  CHEST:  Clear to auscultation bilaterally.  No rales, rhonchi, stridor,  wheezes, or friction rubs.  HEART:  S1, S2.  Regular rate and rhythm, with no murmurs, gallops or  rubs noted.  ABDOMEN:  Nontender, nondistended.  No organomegaly noted.  Positive  bowel sounds throughout.  GU:  No pertinent.  EXTREMITIES:  As per HPI.  SKIN:  Intact without any lesions or rashes.   IMPRESSION:  Degenerative spondylolisthesis and spinal stenosis.   PLAN:  Admit to [facility] on May 20, 2006, for an L3 to  L5 laminectomy and L4-5 fusion to be done by Dr. Noel Gerold.      Verlin Fester, P.A.     CM/MEDQ  D:  05/18/2006  T:  05/18/2006  Job:  [id number]

## 2010-08-23 NOTE — Discharge Summary (Signed)
Pamela Dalton, Pamela Dalton NO.:  [id number]   MEDICAL RECORD NO.:  [id number]          PATIENT TYPE:  INP   LOCATION:  5021                         FACILITY:  [facility]   PHYSICIAN:  Sharolyn Douglas, M.D.        DATE OF BIRTH:  Jan 29, 1951   DATE OF ADMISSION:  05/20/2006  DATE OF DISCHARGE:  05/23/2006                               DISCHARGE SUMMARY   ADMITTING DIAGNOSES:  1. Degenerative spondylolisthesis and spinal stenosis.  2. Hypertension.   DISCHARGE DIAGNOSES:  1. Status post lumbar 3-5 laminectomy and 4-5 fusion.  2. Postoperative acute blood loss anemia.  3. Postoperative hypokalemia.  4. Postoperative hyperglycemia.  5. Hypertension.   PROCEDURE:  On May 20, 2006, the patient was taken to the operating  room for an L3-5 laminectomy and L4-5 fusion.   SURGEON:  Sharolyn Douglas, MD.   ASSISTANTVerlin Fester, PA.   ANESTHESIA:  General.   CONSULTS:  None.   LABORATORY:  CBC with diff preop was normal with the exception of RDW of  14.3, neutrophils of 42 and lymphs of 47.  Postoperatively, H&H was  monitored x3 days, reached a low of 10.0 and 29.5 on postoperative day  2.  Complete metabolic panel preop showed a potassium of 3.4, otherwise  normal.  Postoperatively BMET x2 days:  On postop day 1, glucose 111,  BUN 4.  Postop day 2, sodium was 134, glucose 130, BUN of 4; otherwise  normal.  UA from preop shows moderate hemoglobin, 11-20 WBCs, 3-6 RBCs,  and few bacteria.  Urine culture showed E. coli.  Blood typing was A-  negative.  Antibody screen was negative.  Lumbar x-rays from May 22, 2006, were used intraoperatively for localization.  On May 23, 2006, lumbar x-ray showed post laminectomy and L4-5 fusion.  On _______  complicating features.  No EKG was seen on the chart.   BRIEF HISTORY:  The patient is a 60 year old female with a longstanding  history of problems and pain in her back that extends into her left  greater than right lower  extremity.  She has tried a long course of  conservative treatment without improvement.  The pain is limiting her  ability to function and work.  Risks and benefits of laminectomy and  fusion procedure was discussed with her secondary to her failure to  improve as well as her MRI and x-ray findings.  Dr. Noel Gerold and I both  discussed the risks and benefits with her.  She indicated understanding  and opted to proceed with the surgery.   HOSPITAL COURSE:  On May 20, 2006, the patient was admitted to the  hospital and taken to the operating room for the above listed procedure.  She tolerated the procedure well without any intraoperative  complications.  She was sent to the emergency room in stable condition.   Postoperatively, routine orthopedic spine protocol was followed.  She  progressed along extremely well.  She did not develop any medical  complications postoperatively.   Physical therapy and occupational therapy worked with her on a daily  basis, and  she progressed along very quickly with them.  It got to the  point that she was stable and independent prior to discharge.  Physical  therapy was working on brace use, back precautions, and progressive  ambulation program.   By May 23, 2006, the patient was doing extremely well.  She was  stable from a medical standpoint, had met all orthopedic goals.   DISCHARGE. PLAN:  The patient is a 60 year old female who is status post  L3-5 laminectomy and L4-5 fusion, doing very well.  Activities of daily  ambulation program.  Brace on when she is up.  Back precautions at all  times.  No lifting greater than 5 pounds.  The patient may shower.  Follow up in 2 weeks postoperatively with Dr. Noel Gerold.   DIET:  Regular home diet as tolerated.   MEDICATIONS ON DISCHARGE:  1. Vicodin for pain.  2. Robaxin for muscle spasm.  3. Multivitamin daily.  4. Calcium 1200 daily.  5. Colace 100 mg twice daily.  6. Laxative as needed.   Avoid  NSAID x3 months.   CONDITION ON DISCHARGE:  Stable, improved.   DISPOSITION:  The patient is being discharged to her home with her  family's assistance as well as home health, physical therapy, and  occupational therapy.      Verlin Fester, P.A.      Sharolyn Douglas, M.D.  Electronically Signed    CM/MEDQ  D:  07/30/2006  T:  07/30/2006  Job:  [id number]   cc:   Sharolyn Douglas, M.D.

## 2010-09-24 ENCOUNTER — Other Ambulatory Visit (HOSPITAL_COMMUNITY): Payer: Self-pay | Admitting: Family Medicine

## 2010-09-24 DIAGNOSIS — Z1231 Encounter for screening mammogram for malignant neoplasm of breast: Secondary | ICD-10-CM

## 2010-10-28 ENCOUNTER — Ambulatory Visit (HOSPITAL_COMMUNITY)
Admission: RE | Admit: 2010-10-28 | Discharge: 2010-10-28 | Disposition: A | Discharge status: Home / Self Care | Payer: Medicare Other | Source: Ambulatory Visit | Attending: Family Medicine | Admitting: Family Medicine

## 2010-10-28 DIAGNOSIS — Z1231 Encounter for screening mammogram for malignant neoplasm of breast: Secondary | ICD-10-CM

## 2011-01-13 LAB — COMPREHENSIVE METABOLIC PANEL
AST: 26
BUN: 19
CO2: 29
Calcium: 10.5
Creatinine, Ser: 0.89
GFR calc Af Amer: >60
GFR calc non Af Amer: >60
Glucose, Bld: 106 — ABNORMAL HIGH

## 2011-01-13 LAB — DIFFERENTIAL
Lymphocytes Relative: 60 — ABNORMAL HIGH
Lymphs Abs: 3.7
Neutro Abs: 1.7
Neutrophils Relative %: 27 — ABNORMAL LOW

## 2011-01-13 LAB — CALCIUM
Calcium: 10.2
Calcium: 9.8

## 2011-01-13 LAB — CBC
MCHC: 33.8
MCV: 91.7
RBC: 4.22

## 2011-09-22 ENCOUNTER — Other Ambulatory Visit (HOSPITAL_COMMUNITY): Payer: Self-pay | Admitting: Family Medicine

## 2011-09-22 DIAGNOSIS — Z1231 Encounter for screening mammogram for malignant neoplasm of breast: Secondary | ICD-10-CM

## 2011-10-29 ENCOUNTER — Ambulatory Visit (HOSPITAL_COMMUNITY): Payer: Medicare Other

## 2011-11-03 ENCOUNTER — Emergency Department (HOSPITAL_COMMUNITY)
Admission: EM | Admit: 2011-11-03 | Discharge: 2011-11-03 | Disposition: A | Discharge status: Home / Self Care | Payer: Medicare Other | Attending: Emergency Medicine | Admitting: Emergency Medicine

## 2011-11-03 DIAGNOSIS — G43909 Migraine, unspecified, not intractable, without status migrainosus: Secondary | ICD-10-CM | POA: Insufficient documentation

## 2011-11-03 MED ORDER — METOCLOPRAMIDE HCL 10 MG PO TABS
10.0000 mg | ORAL_TABLET | Freq: Once | ORAL | Status: AC
  Administered 2011-11-03: 10 mg via ORAL
  Filled 2011-11-03: qty 1

## 2011-11-03 MED ORDER — DIPHENHYDRAMINE HCL 25 MG PO CAPS
25.0000 mg | ORAL_CAPSULE | Freq: Once | ORAL | Status: AC
  Administered 2011-11-03: 25 mg via ORAL
  Filled 2011-11-03: qty 1

## 2011-11-03 MED ORDER — ONDANSETRON HCL 4 MG/2ML IJ SOLN
INTRAMUSCULAR | Status: AC
  Administered 2011-11-03: 14:00:00
  Filled 2011-11-03: qty 2

## 2011-11-03 MED ORDER — ONDANSETRON HCL 4 MG PO TABS: 4.0000 mg | ORAL_TABLET | Freq: Four times a day (QID) | ORAL | Status: AC

## 2011-11-03 MED ORDER — KETOROLAC TROMETHAMINE 60 MG/2ML IM SOLN
30.0000 mg | Freq: Once | INTRAMUSCULAR | Status: AC
  Administered 2011-11-03: 30 mg via INTRAMUSCULAR
  Filled 2011-11-03: qty 2

## 2011-11-03 NOTE — ED Notes (Addendum)
Patient woke up with a headache at 0530 today.  Her headache progressively worsened over the day.  EMS called at 1300.   Patient has a 7 year remote history of migranes.  She has not taken her BP meds for 2 months. 4 mg zofran given per EMS.

## 2011-11-03 NOTE — ED Provider Notes (Signed)
History     CSN: [id number]  Arrival date & time 11/03/11  1343   First MD Initiated Contact with Patient 11/03/11 1347      Chief Complaint  Patient presents with  . Migraine    (Consider location/radiation/quality/duration/timing/severity/associated sxs/prior treatment) HPI  No past medical history on file.  No past surgical history on file.  No family history on file.  History  Substance Use Topics  . Smoking status: Not on file  . Smokeless tobacco: Not on file  . Alcohol Use: Not on file    OB History    No data available      Review of Systems  Allergies  Codeine  Home Medications  No current outpatient prescriptions on file.  BP 147/98  Pulse 73  Temp 98.6 F (37 C) (Oral)  Resp 16  SpO2 100%  Physical Exam  ED Course  Procedures (including critical care time)  Labs Reviewed - No data to display No results found.   No diagnosis found.    MDM  Pt is being evaluated by NP Pattricia Boss C.  I did also obtain H&P.  Pt presented for evaluation of a severe headache that began in the early morning.  She has had similar headaches in the past. She denies any focal neurologic symptoms.  After receiving medications the headache is almost completely resolved.  If symptoms continue to improve, plan discharge home.        Tobin Chad, MD 11/03/11 1705

## 2011-11-03 NOTE — ED Notes (Signed)
Patient states that her migraine came on this AM and continues to worsen.  C/O nausea and vomiting, blurred vision and photophobia. States that she has not had a migraine in 7 years.

## 2011-11-03 NOTE — ED Provider Notes (Signed)
History     CSN: [id number]  Arrival date & time 11/03/11  1343   First MD Initiated Contact with Patient 11/03/11 1347      Chief Complaint  Patient presents with  . Migraine    (Consider location/radiation/quality/duration/timing/severity/associated sxs/prior treatment) Patient is a 61 y.o. female presenting with migraine. The history is provided by the patient. No language interpreter was used.  Migraine This is a recurrent problem. The current episode started today. The problem occurs constantly. The problem has been gradually worsening. Associated symptoms include headaches, nausea and vomiting. Pertinent negatives include no fever, neck pain, vertigo or weakness.   61 year old female coming in with migraine headache that woke her from her sleep around 2 AM. Patient states that she has had nausea vomiting with the pain that is shooting across her forehead bilateral with photophobia and blurred vision. Patient was hypertensive upon arrival. Patient states that her left eye is also been watering. States that her last migraine headache was 7 years ago but she does have a history of migraines. Has taken nothing for pain.  Denies aura, ataxia diplopia, weakness, vertigo,  injury, dizziness, sinus congestion neck pain /stiffness or fever.   Neuro in tact.    No past medical history on file.  No past surgical history on file.  No family history on file.  History  Substance Use Topics  . Smoking status: Not on file  . Smokeless tobacco: Not on file  . Alcohol Use: Not on file    OB History    No data available      Review of Systems  Constitutional: Negative.  Negative for fever.  HENT: Negative for neck pain.   Eyes: Positive for photophobia and pain.  Respiratory: Negative.  Negative for chest tightness and shortness of breath.   Cardiovascular: Negative.  Negative for leg swelling.  Gastrointestinal: Positive for nausea and vomiting.  Musculoskeletal: Negative for back  pain and gait problem.  Neurological: Positive for headaches. Negative for vertigo and weakness.  Psychiatric/Behavioral: Negative.   All other systems reviewed and are negative.    Allergies  Codeine  Home Medications  No current outpatient prescriptions on file.  BP 140/107  Pulse 68  Temp 98.6 F (37 C) (Oral)  Resp 16  SpO2 7%  Physical Exam  Nursing note and vitals reviewed. Constitutional: She is oriented to person, place, and time. She appears well-developed and well-nourished.  HENT:  Head: Normocephalic and atraumatic.  Eyes: Conjunctivae and EOM are normal. Pupils are equal, round, and reactive to light.  Neck: Normal range of motion. Neck supple.  Cardiovascular: Normal rate, regular rhythm, normal heart sounds and intact distal pulses.  Exam reveals no gallop and no friction rub.   No murmur heard. Pulmonary/Chest: Effort normal and breath sounds normal.  Abdominal: Soft. Bowel sounds are normal.  Musculoskeletal: Normal range of motion. She exhibits no edema and no tenderness.  Neurological: She is alert and oriented to person, place, and time. She has normal reflexes.  Skin: Skin is warm and dry.  Psychiatric: She has a normal mood and affect.    ED Course  Procedures (including critical care time)  Labs Reviewed - No data to display No results found.   No diagnosis found.    MDM  Migraine h/a with relief after migraine cocktail in the ER.  Will follow up with pcp for hypertension. B/p  147/98 at discharge.   Rx for zofran. Neuro in tact.  Remi Haggard, NP 11/04/11 [phone]

## 2011-11-13 ENCOUNTER — Ambulatory Visit (HOSPITAL_COMMUNITY)
Admission: RE | Admit: 2011-11-13 | Discharge: 2011-11-13 | Disposition: A | Discharge status: Home / Self Care | Payer: Medicare Other | Source: Ambulatory Visit | Attending: Family Medicine | Admitting: Family Medicine

## 2011-11-13 DIAGNOSIS — Z1231 Encounter for screening mammogram for malignant neoplasm of breast: Secondary | ICD-10-CM

## 2012-07-13 ENCOUNTER — Other Ambulatory Visit: Payer: Self-pay | Admitting: Orthopaedic Surgery

## 2012-07-13 DIAGNOSIS — M5136 Other intervertebral disc degeneration, lumbar region: Secondary | ICD-10-CM

## 2012-07-19 ENCOUNTER — Other Ambulatory Visit: Payer: Medicare Other

## 2012-07-19 ENCOUNTER — Ambulatory Visit
Admission: RE | Admit: 2012-07-19 | Discharge: 2012-07-19 | Disposition: A | Discharge status: Home / Self Care | Payer: Medicare Other | Source: Ambulatory Visit | Attending: Orthopaedic Surgery | Admitting: Orthopaedic Surgery

## 2012-07-19 DIAGNOSIS — M5136 Other intervertebral disc degeneration, lumbar region: Secondary | ICD-10-CM

## 2012-10-18 ENCOUNTER — Other Ambulatory Visit: Payer: Self-pay | Admitting: Family Medicine

## 2012-10-18 DIAGNOSIS — Z1231 Encounter for screening mammogram for malignant neoplasm of breast: Secondary | ICD-10-CM

## 2012-11-15 ENCOUNTER — Ambulatory Visit (HOSPITAL_COMMUNITY)
Admission: RE | Admit: 2012-11-15 | Discharge: 2012-11-15 | Disposition: A | Discharge status: Home / Self Care | Payer: Medicare Other | Source: Ambulatory Visit | Attending: Family Medicine | Admitting: Family Medicine

## 2012-11-15 DIAGNOSIS — Z1231 Encounter for screening mammogram for malignant neoplasm of breast: Secondary | ICD-10-CM | POA: Insufficient documentation

## 2012-12-09 ENCOUNTER — Encounter: Payer: Self-pay | Admitting: Obstetrics & Gynecology

## 2012-12-09 ENCOUNTER — Ambulatory Visit (INDEPENDENT_AMBULATORY_CARE_PROVIDER_SITE_OTHER): Payer: Medicare Other | Admitting: Obstetrics & Gynecology

## 2012-12-09 VITALS — BP 125/89 | HR 89 | Temp 96.6°F | Ht 61.0 in | Wt 216.5 lb

## 2012-12-09 DIAGNOSIS — R32 Unspecified urinary incontinence: Secondary | ICD-10-CM

## 2012-12-09 LAB — POCT URINALYSIS DIP (DEVICE)
Bilirubin Urine: NEGATIVE
Glucose, UA: NEGATIVE mg/dL
Ketones, ur: NEGATIVE mg/dL
Leukocytes, UA: NEGATIVE
Nitrite: NEGATIVE

## 2012-12-09 MED ORDER — TOLTERODINE TARTRATE ER 2 MG PO CP24: 2.0000 mg | ORAL_CAPSULE | Freq: Every day | ORAL | Status: DC

## 2012-12-09 NOTE — Progress Notes (Signed)
Subjective:     Patient ID: Pamela Dalton, female   DOB: 05-13-1950, 62 y.o.   MRN: [id number]  HPI Pt c/o leakage of urine for 4 years.  She has had lots of stressors and has not been able to f/u before this time.  She reports that she feels the urge then she must go immediately or else she will leak. She has nocturia x3-4 nightly and reports that it is worse if she drinks something.  She denies h/o freq UTI's    Past Medical History  Diagnosis Date  . Hypertension   . Diabetes mellitus without complication   . Asthma   . Nerve pain   . Back pain   . Leg pain   . Arthritis    Past Surgical History  Procedure Laterality Date  . Back surgery  2005    L4, L5 fused  . Thumb surgery- joint removed  2009    Arthritis  . Thyroid cyst excision  2009  . Abdominal hysterectomy  1981    one ovary/ tube removed   History   Social History  . Marital Status: Married    Spouse Name: N/A    Number of Children: N/A  . Years of Education: N/A   Occupational History  . Not on file.   Social History Main Topics  . Smoking status: Former Games developer  . Smokeless tobacco: Never Used     Comment: Quit smoking 2007  . Alcohol Use: No     Comment: stopped drinking 2009  . Drug Use: No     Comment: past history marijauna 2013  . Sexual Activity: No   Other Topics Concern  . Not on file   Social History Narrative  . No narrative on file   Family History  Problem Relation Age of Onset  . Alzheimer's disease Father      Medication List       This list is accurate as of: 12/09/12  2:22 PM.  Always use your most recent med list.               amLODipine 5 MG tablet  Commonly known as:  NORVASC  5 mg daily.     aspirin 81 MG tablet  Take 81 mg by mouth daily.     benazepril 10 MG tablet  Commonly known as:  LOTENSIN  10 mg daily.     cyclobenzaprine 10 MG tablet  Commonly known as:  FLEXERIL  10 mg 3 (three) times daily as needed.     fluticasone 50 MCG/ACT nasal spray   Commonly known as:  FLONASE  2 sprays daily.     Fluticasone-Salmeterol 100-50 MCG/DOSE Aepb  Commonly known as:  ADVAIR  Inhale 1 puff into the lungs every 12 (twelve) hours.     gabapentin 300 MG capsule  Commonly known as:  NEURONTIN  600 mg 3 (three) times daily.     hydrochlorothiazide 25 MG tablet  Commonly known as:  HYDRODIURIL  25 mg daily.     meloxicam 15 MG tablet  Commonly known as:  MOBIC  15 mg daily.     metFORMIN 500 MG tablet  Commonly known as:  GLUCOPHAGE  500 mg daily with breakfast.     montelukast 10 MG tablet  Commonly known as:  SINGULAIR  10 mg at bedtime.     pantoprazole 40 MG tablet  Commonly known as:  PROTONIX  40 mg daily.     traMADol 50 MG tablet  Commonly known as:  ULTRAM  50 mg every 8 (eight) hours as needed. 1-2 as needed         Review of Systems     Objective:   Physical Exam BP 125/89  Pulse 89  Temp(Src) 96.6 F (35.9 C)  Ht 5\' 1"  (1.549 m)  Wt 216 lb 8 oz (98.204 kg)  BMI 40.93 kg/m2 Pt in NAD Abd: obese, NT, ND GU: EGBUS: no lesions; Q-tip test moves 50 degrees  Vagina: no blood in vault; small cystocele. Cervix and uterus surgically absent Adnexa: no masses: non tender   Urine dipstick shows negative for all components. .      Assessment:     Incontinence - suspect urge/overactive bladder    Plan:     Detrol LA 2mg  D/w pt potential side effects F/u in 2 months or sooner prn.  May need to increase dosage at next visit

## 2012-12-09 NOTE — Progress Notes (Signed)
Referred her for urinary incontinence.  States got worse in 2010 had to start pads.

## 2012-12-09 NOTE — Patient Instructions (Addendum)
Urinary Incontinence Your doctor wants you to have this information about urinary incontinence. This is the inability to keep urine in your body until you decide to release it. CAUSES  Prostate gland enlargement is a common cause of urinary incontinence. But there are many different causes for losing urinary control. They include:  Medicines.  Infections.  Prostate problems.  Surgery.  Neurological diseases.  Emotional factors. DIAGNOSIS  Evaluating the cause of incontinence is important in choosing the best treatment. This may require:  An ultrasound exam.  Kidney and bladder X-rays.  Cystoscopy. This is an exam of the bladder using a narrow scope. TREATMENT  For incontinent patients, normal daily hygiene and using changing pads or adult diapers regularly will prevent offensive odors and skin damage from the moisture. Changing your medicines may help control incontinence. Your caregiver may prescribe some medicines to help you regain control. Avoid caffeine. It can over-stimulate the bladder. Use the bathroom regularly. Try about every 2 to 3 hours even if you do not feel the need. Take time to empty your bladder completely. After urinating, wait a minute. Then try to urinate again. External devices used to catch urine or an indwelling urine catheter (Foley catheter) may be needed as well. Some prostate gland problems require surgery to correct. Call your caregiver for more information. Document Released: 05/01/2004 Document Revised: 06/16/2011 Document Reviewed: 04/26/2008 [facility] Patient Information 2014 [place], [place].  Overactive Bladder, Adult The bladder has two functions that are totally opposite of the other. One is to relax and stretch out so it can store urine (fills like a balloon), and the other is to contract and squeeze down so that it can empty the urine that it has stored. Proper functioning of the bladder is a complex mixing of these two functions. The filling and  emptying of the bladder can be influenced by:  The bladder.  The spinal cord.  The brain.  The nerves going to the bladder.  Other organs that are closely related to the bladder such as prostate in males and the vagina in females. As your bladder fills with urine, nerve signals are sent from the bladder to the brain to tell you that you may need to urinate. Normal urination requires that the bladder squeeze down with sufficient strength to empty the bladder, but this also requires that the bladder squeeze down sufficiently long to finish the job. In addition the sphincter muscles, which normally keep you from leaking urine, must also relax so that the urine can pass. Coordination between the bladder muscle squeezing down and the sphincter muscles relaxing is required to make everything happen normally. With an overactive bladder sometimes the muscles of the bladder contract unexpectedly and involuntarily and this causes an urgent need to urinate. The normal response is to try to hold urine in by contracting the sphincter muscles. Sometimes the bladder contracts so strongly that the sphincter muscles cannot stop the urine from passing out and incontinence occurs. This kind of incontinence is called urge incontinence. Having an overactive bladder can be embarrassing and awkward. It can keep you from living life the way you want to. Many people think it is just something you have to put up with as you grow older or have certain health conditions. In fact, there are treatments that can help make your life easier and more pleasant. CAUSES  Many things can cause an overactive bladder. Possibilities include:  Urinary tract infection or infection of nearby tissues such as the prostate.  Prostate enlargement.  In women, multiple pregnancies or surgery on the uterus or urethra.  Bladder stones, inflammation or tumors.  Caffeine.  Alcohol.  Medications. For example, diuretics (drugs that help the  body get rid of extra fluid) increase urine production. Some other medicines must be taken with lots of fluids.  Muscle or nerve weakness. This might be the result of a spinal cord injury, a stroke, multiple sclerosis or Parkinson's disease.  Diabetes can cause a high urine volume which fills the bladder so quickly that the normal urge to urinate is triggered very strongly. SYMPTOMS   Loss of bladder control. You feel the need to urinate and cannot make your body wait.  Sudden, strong urges to urinate.  Urinating 8 or more times a day.  Waking up to urinate two or more times a night. DIAGNOSIS  To decide if you have overactive bladder, your healthcare provider will probably:  Ask about symptoms you have noticed.  Ask about your overall health. This will include questions about any medications you are taking.  Do a physical examination. This will help determine if there are obvious blockages or other problems.  Order some tests. These might include:  A blood test to check for diabetes or other health issues that could be contributing to the problem.  Urine testing. This could measure the flow of urine and the pressure on the bladder.  A test of your neurological system (the brain, spinal cord and nerves). This is the system that senses the need to urinate. Some of these tests are called flow tests, bladder pressure tests and electrical measurements of the sphincter muscle.  A bladder test to check whether it is emptying completely when you urinate.  Cytoscopy. This test uses a thin tube with a tiny camera on it. It offers a look inside your urethra and bladder to see if there are problems.  Imaging tests. You might be given a contrast dye and then asked to urinate. X-rays are taken to see how your bladder is working. TREATMENT  An overactive bladder can be treated in many ways. The treatment will depend on the cause. Whether you have a mild or severe case also makes a difference.  Often, treatment can be given in your healthcare provider's office or clinic. Be sure to discuss the different options with your caregiver. They include:  Behavioral treatments. These do not involve medication or surgery:  Bladder training. For this, you would follow a schedule to urinate at regular intervals. This helps you learn to control the urge to urinate. At first, you might be asked to wait a few minutes after feeling the urge. In time, you should be able to schedule bathroom visits an hour or more apart.  Kegel exercises. These exercises strengthen the pelvic floor muscles, which support the bladder. By toning these muscles, they can help control urination, even if the bladder muscles are overactive. A specialist will teach you how to do these exercises correctly. They will require daily practice.  Weight loss. If you are obese or overweight, losing weight might stop your bladder from being overactive. Talk to your healthcare provider about how many pounds you should lose. Also ask if there is a specific program or method that would work best for you.  Diet change. This might be suggested if constipation is making your overactive bladder worse. Your healthcare provider or a nutritionist can explain ways to change what you eat to ease constipation. Other people might need to take in less caffeine or alcohol.  Sometimes drinking fewer fluids is needed, too.  Protection. This is not an actual treatment. But, you could wear special pads to take care of any leakage while you wait for other treatments to take effect. This will help you avoid embarrassment.  Physical treatments.  Electrical stimulation. Electrodes will send gentle pulses to the nerves or muscles that help control the bladder. The goal is to strengthen them. Sometimes this is done with the electrodes outside of the body. Or, they might be placed inside the body (implanted). This treatment can take several months to have an  effect.  Medications. These are usually used along with other treatments. Several medicines are available. Some are injected into the muscles involved in urination. Others come in pill form. Medications sometimes prescribed include:  Anticholinergics. These drugs block the signals that the nerves deliver to the bladder. This keeps it from releasing urine at the wrong time. Researchers think the drugs might help in other ways, too.  Imipramine. This is an antidepressant. But, it relaxes bladder muscles.  Botox. This is still experimental. Some people believe that injecting it into the bladder muscles will relax them so they work more normally. It has also been injected into the sphincter muscle when the sphincter muscle does not open properly. This is a temporary fix, however. Also, it might make matters worse, especially in older people.  Surgery.  A device might be implanted to help manage your nerves. It works on the nerves that signal when you need to urinate.  Surgery is sometimes needed with electrical stimulation. If the electrodes are implanted, this is done through surgery.  Sometimes repairs need to be made through surgery. For example, the size of the bladder can be changed. This is usually done in severe cases only. HOME CARE INSTRUCTIONS   Take any medications your healthcare provider prescribed or suggested. Follow the directions carefully.  Practice any lifestyle changes that are recommended. These might include:  Drinking less fluid or drinking at different times of the day. If you need to urinate often during the night, for example, you may need to stop drinking fluids early in the evening.  Cutting down on caffeine or alcohol. They can both make an overactive bladder worse. Caffeine is found in coffee, tea and sodas.  Doing Kegel exercises to strengthen muscles.  Losing weight, if that is recommended.  Eating a healthy and balanced diet. This will help you avoid  constipation.  Keep a journal or a log. You might be asked to record how much you drink and when, and also when you feel the need to urinate.  Learn how to care for implants or other devices, such as pessaries. SEEK MEDICAL CARE IF:   Your overactive bladder gets worse.  You feel increased pain or irritation when you urinate.  You notice blood in your urine.  You have questions about any medications or devices that your healthcare provider recommended.  You notice blood, pus or swelling at the site of any test or treatment procedure.  You have an oral temperature above 102 F (38.9 C). SEEK IMMEDIATE MEDICAL CARE IF:  You have an oral temperature above 102 F (38.9 C), not controlled by medicine. Document Released: 01/18/2009 Document Revised: 06/16/2011 Document Reviewed: 01/18/2009 [facility] Patient Information 2014 [place], [place].

## 2012-12-27 HISTORY — PX: OTHER SURGICAL HISTORY: SHX169

## 2013-01-17 ENCOUNTER — Other Ambulatory Visit: Payer: Self-pay | Admitting: *Deleted

## 2013-01-17 DIAGNOSIS — R32 Unspecified urinary incontinence: Secondary | ICD-10-CM

## 2013-01-17 NOTE — Telephone Encounter (Signed)
Received fax in error for tolterodine- called pharmacy to verify prescription had just been filled

## 2013-02-17 ENCOUNTER — Encounter: Payer: Self-pay | Admitting: Obstetrics & Gynecology

## 2013-02-17 ENCOUNTER — Ambulatory Visit (INDEPENDENT_AMBULATORY_CARE_PROVIDER_SITE_OTHER): Payer: Medicare Other | Admitting: Obstetrics & Gynecology

## 2013-02-17 VITALS — BP 119/81 | HR 103 | Temp 97.0°F | Ht 62.0 in | Wt 223.7 lb

## 2013-02-17 DIAGNOSIS — R32 Unspecified urinary incontinence: Secondary | ICD-10-CM

## 2013-02-17 MED ORDER — TOLTERODINE TARTRATE ER 2 MG PO CP24: 4.0000 mg | ORAL_CAPSULE | Freq: Every day | ORAL | Status: DC

## 2013-02-17 NOTE — Patient Instructions (Signed)
Neurogenic Bladder Neurogenic bladder is a loss of normal control of bladder function. This is caused by damaged nerves, which can be a result of a variety of injuries and diseases.  The muscles and nerves of the urinary system work together to store urine and release urine at the right time. Nerves carry messages from the bladder to the brain and from the brain to the muscles of the bladder. In a neurogenic bladder, the nerves that are supposed to carry these messages do not work properly.There are 2 types of neurogenic bladder:  Overactive.  The bladder is unable to control when or how much to urinate. Even when only a small amount of urine is in the bladder, there may be an urge to urinate that cannot be controlled. This can result in wetting accidents (urge incontinence).  Underactive. The bladder holds much more urine than normal. Small amounts of urine leak out as bladder pressure builds because there is not a sensation that the bladder is full. This can also result in wetting accidents. CAUSES  Neurogenic bladder can be caused by many problems. These include:   Stroke.  Multiple sclerosis.  Infection.  Trauma and injuries to the spine, spinal cord, depending on the level in the back of the injury.  Diabetes.  Parkinson's Disease.  Brain and spinal cord tumors.  Birth defects that affect the brain or spinal cord.  Guillain-Barr syndrome.  Complications of surgery involving the spine, spinal cord, or pelvis. SYMPTOMS  The following are the most common problems and symptoms of neurogenic bladder. However, each individual may experience symptoms differently.   Urinary tract infection symptoms:  Pain or burning when urinating.  Back or flank pain.  Cloudy or bloody urine.  Fever.  Kidney stone symptoms. Symptoms of kidney stones include:  Chills.  Shivering.  Feeling sick to your stomach (nausea) and/or vomiting.  Fever.  Loss of bladder control (urinary  incontinence).  Small urine volume when emptying bladder (voiding).  Urinary frequency and urgency.  Dribbling urine.  Loss of sensation of bladder fullness.  Kidney failure.  Infection in the blood stream (sepsis). DIAGNOSIS  When neurogenic bladder is suspected, both the nervous system and the bladder are examined. In addition to reviewing your complete medical history and having a physical exam, diagnostic procedures for neurogenic bladder may include:  X-rays of the spine.  Imaging tests of the kidneys, ureters, and bladder. These tests may include:  MRI.  CT scan.  Ultrasound.  Urodynamics or Cystometrogram (CMG). Thistests the nerves and muscles of the bladder. The test can show how much the bladder can hold and if it empties completely.  EMG of the sphincter . This is measure of the electrical activity of the sphincter muscle and this helps determine when the sphincter is contracting or squeezing down.  Video studies of the bladder and sphincter while you are urinating.  Cystoscopy . Looking inside the bladder with a telescope like instrument. TREATMENT  Specific treatment for neurogenic bladder will be determined by your caregiver based on:  Your age, overall health, and medical history.  Severity of symptoms.  Cause of the nerve damage.  Type of bladder problem present.  Your tolerance for specific medications, procedures, or therapies.  Expectations for the course of the condition.  Your opinion or preference. Treatment may include:  Antibiotic medicine.  Medications.  Insertion of a flexible tube (catheter) to empty the bladder.  Surgery to create an artificial sphincterto prevent urinary leakage.  Sacral nerve stimulation (SNS) to   help stimulate the nerves in order to empty the bladder.  Sling surgery to hold the neck of the bladder and urethra in the proper position to prevent leakage.  Bladder augmentation.  Ileal loop surgery to connect  a section of intestine to the ureters and positioned out to a opening on the abdomen. Urine comes out and can be collected into a plastic bag.  Perineal pads may be used to catch urine. HOME CARE INSTRUCTIONS   Take all medications as prescribed by your caregiver.  Review your medications (both prescription and non-prescription) with your caregiver to make sure medications you are presently taking will not be harmful.  Periodic blood, urine, and imaging tests may be required. Follow your caregiver's advice regarding the timing of these.  Follow the catheter care instructions if you use a catheter.  Use disposiable underwear if you have bladder weakness. SEEK MEDICAL CARE IF:   You have increasing fatigue or weakness.  You find there is less and less control of the urine stream despite treatment.  You develop a loss of appetite or nausea.  You begin to have trouble inserting the catheter.  Your urine appears somewhat dark, cloudy, or has an unusual smell. SEEK IMMEDIATE MEDICAL CARE IF:   You develop worsening abdominal or back pain.  You cannot pass any urine.  Your urine becomes bloody.  You experience a lot of burning while urinating.  You have a fever.  You keep vomiting.  You have bleeding with catheter insertion. Document Released: 10/05/2006 Document Revised: 03/10/2012 Document Reviewed: 01/19/2009 [employer] Patient Information 2014 [employer].  

## 2013-02-17 NOTE — Progress Notes (Signed)
Subjective:     Patient ID: Pamela Dalton, female   DOB: [birth year], 62 y.o.   MRN: [id number]  HPI  Mixed incontinence: Pt has a ten year history of urinary incontinence. She was evaluated in September and started on Detrol LA, which she has tolerated well without side effects. She states it was working well for her for the first month. She states it changed her life, and she was able to go outside without worry. Over the course of the last month it has not worked as well. She has had 2-3 accidents a week. She  Again has to wear a pad at all times. She denies dysuria, abdominal pain or vaginal irritation. She would like to increase her dosage today.   Review of Systems Negative, with the exception of above mentioned in HPI     Objective:   Physical Exam BP 119/81  Pulse 103  Temp(Src) 97 F (36.1 C) (Oral)  Ht 5\' 2"  (1.575 m)  Wt 223 lb 11.2 oz (101.47 kg)  BMI 40.91 kg/m2 Gen: NAD. Pleasant CV: Mildly Tachy. Regular rhythm.  Abd: Soft. NTND. BS present.     Assessment/Plan:    Mixed urinary incontinence: - Increased Detrol LA to 4 mg QD - F/U: 3 months

## 2013-02-18 ENCOUNTER — Encounter: Payer: Self-pay | Admitting: Obstetrics & Gynecology

## 2013-02-18 NOTE — Progress Notes (Signed)
Patient ID: Pamela Dalton, female   DOB: Oct 16, 1950, 62 y.o.   MRN: [id number] Attestation of Attending Supervision of Resident: Evaluation and management procedures were performed by the [facility] Resident under my supervision.  I have seen and examined the patient, reviewed the resident's note and chart, and I agree with the management and plan.  The pt feels like her life greatly improved after starting the Detrol but, she has not gotten complete relief and wants to increase the dosage.  She c/o dry mouth initially which has resolved.  Dertrol increased to 4mg .  Pt to f/u in 3 months or sooner prn  Anibal Henderson, M.D. 02/18/2013 7:49 AM

## 2013-02-28 ENCOUNTER — Telehealth: Payer: Self-pay | Admitting: *Deleted

## 2013-02-28 NOTE — Telephone Encounter (Signed)
Called patient and verified she went to location medication was sent- she did. Informed her I would call the pharmacy and get it straightened out .  Called pharmacy and there was a problem with the dosage ordered- Dr. Erin Fulling spoke with pharmacist and order changed to Detrol LA 4mg  po  Daily with 3 refills.

## 2013-02-28 NOTE — Telephone Encounter (Signed)
Pt left message that she is trying to get her medicine that was supposed to be sent in at her last visit. She wants it sent to the [facility] on [address].

## 2013-06-01 ENCOUNTER — Telehealth: Payer: Self-pay | Admitting: *Deleted

## 2013-06-01 NOTE — Telephone Encounter (Signed)
Pt left message stating that her insurance has changed and she will not be able to refill one of her medications next month. She was given 4 alternate options from the insurance company. She requests a call back to see if any of the options will be possible. She currently has enough medication for a month but then will need to switch. Her doctor is [facility]

## 2013-06-01 NOTE — Telephone Encounter (Addendum)
Pt stated that the medication Dr. Ihor Dow prescribed tolterodine capsule is too expensive can she get a different Rx. Called pt and pt informed me that the medication she was taking her insurance says she will need another formula her insurance does not cover.  Pt stated that the pharmacist gave her these recommendations (patient spelled out)- oxybutyniner, gelnique, myrbetriq, and vesicar. I informed pt that we will send the provider a message and to please allow [place] time to contact the provider.  Pt agreed.

## 2013-06-06 ENCOUNTER — Other Ambulatory Visit: Payer: Self-pay | Admitting: Obstetrics & Gynecology

## 2013-06-06 DIAGNOSIS — R3915 Urgency of urination: Secondary | ICD-10-CM

## 2013-06-06 DIAGNOSIS — N3941 Urge incontinence: Secondary | ICD-10-CM

## 2013-06-06 MED ORDER — OXYBUTYNIN CHLORIDE ER 5 MG PO TB24
5.0000 mg | ORAL_TABLET | Freq: Every day | ORAL | Status: DC
Start: 2013-06-06 — End: 2013-06-16

## 2013-06-14 NOTE — Telephone Encounter (Signed)
-----   Message from Lavonia Drafts, MD sent at 06/06/2013 9:42 PM -----     Per your prev message. She can switch to this. We may need to increase the dosage after 1 month. Thx        Called pt to verify that she picked up the medication that Dr. Ihor Dow ordered which is the oxybutynin.  Pt stated that she has picked up the medication and that she has a follow up appt scheduled for 07/06/13.  I advised to patient that the provider will evaluate to make sure that the dosage does not need to be increased.  Pt stated understanding with no further questions.

## 2013-06-16 ENCOUNTER — Encounter (HOSPITAL_COMMUNITY): Payer: Self-pay | Admitting: Emergency Medicine

## 2013-06-16 ENCOUNTER — Emergency Department (HOSPITAL_COMMUNITY): Payer: PRIVATE HEALTH INSURANCE

## 2013-06-16 ENCOUNTER — Emergency Department (HOSPITAL_COMMUNITY)
Admission: EM | Admit: 2013-06-16 | Discharge: 2013-06-16 | Disposition: A | Discharge status: Home / Self Care | Payer: PRIVATE HEALTH INSURANCE | Attending: Emergency Medicine | Admitting: Emergency Medicine

## 2013-06-16 DIAGNOSIS — W1809XA Striking against other object with subsequent fall, initial encounter: Secondary | ICD-10-CM | POA: Insufficient documentation

## 2013-06-16 DIAGNOSIS — W19XXXA Unspecified fall, initial encounter: Secondary | ICD-10-CM

## 2013-06-16 DIAGNOSIS — S7002XA Contusion of left hip, initial encounter: Secondary | ICD-10-CM

## 2013-06-16 DIAGNOSIS — Z9889 Other specified postprocedural states: Secondary | ICD-10-CM | POA: Insufficient documentation

## 2013-06-16 DIAGNOSIS — S8000XA Contusion of unspecified knee, initial encounter: Secondary | ICD-10-CM | POA: Insufficient documentation

## 2013-06-16 DIAGNOSIS — E119 Type 2 diabetes mellitus without complications: Secondary | ICD-10-CM | POA: Insufficient documentation

## 2013-06-16 DIAGNOSIS — Z7982 Long term (current) use of aspirin: Secondary | ICD-10-CM | POA: Insufficient documentation

## 2013-06-16 DIAGNOSIS — S40012A Contusion of left shoulder, initial encounter: Secondary | ICD-10-CM

## 2013-06-16 DIAGNOSIS — Z79899 Other long term (current) drug therapy: Secondary | ICD-10-CM | POA: Insufficient documentation

## 2013-06-16 DIAGNOSIS — IMO0002 Reserved for concepts with insufficient information to code with codable children: Secondary | ICD-10-CM | POA: Insufficient documentation

## 2013-06-16 DIAGNOSIS — Z87891 Personal history of nicotine dependence: Secondary | ICD-10-CM | POA: Insufficient documentation

## 2013-06-16 DIAGNOSIS — S39012A Strain of muscle, fascia and tendon of lower back, initial encounter: Secondary | ICD-10-CM

## 2013-06-16 DIAGNOSIS — S40019A Contusion of unspecified shoulder, initial encounter: Secondary | ICD-10-CM | POA: Insufficient documentation

## 2013-06-16 DIAGNOSIS — S8002XA Contusion of left knee, initial encounter: Secondary | ICD-10-CM

## 2013-06-16 DIAGNOSIS — W108XXA Fall (on) (from) other stairs and steps, initial encounter: Secondary | ICD-10-CM | POA: Insufficient documentation

## 2013-06-16 DIAGNOSIS — S7000XA Contusion of unspecified hip, initial encounter: Secondary | ICD-10-CM | POA: Insufficient documentation

## 2013-06-16 DIAGNOSIS — Y929 Unspecified place or not applicable: Secondary | ICD-10-CM | POA: Insufficient documentation

## 2013-06-16 DIAGNOSIS — I10 Essential (primary) hypertension: Secondary | ICD-10-CM | POA: Insufficient documentation

## 2013-06-16 DIAGNOSIS — J45909 Unspecified asthma, uncomplicated: Secondary | ICD-10-CM | POA: Insufficient documentation

## 2013-06-16 DIAGNOSIS — Y9301 Activity, walking, marching and hiking: Secondary | ICD-10-CM | POA: Insufficient documentation

## 2013-06-16 DIAGNOSIS — S335XXA Sprain of ligaments of lumbar spine, initial encounter: Secondary | ICD-10-CM | POA: Insufficient documentation

## 2013-06-16 DIAGNOSIS — S0990XA Unspecified injury of head, initial encounter: Secondary | ICD-10-CM

## 2013-06-16 MED ORDER — HYDROCODONE-ACETAMINOPHEN 5-325 MG PO TABS: 2.0000 | ORAL_TABLET | ORAL | Status: DC | PRN

## 2013-06-16 NOTE — Discharge Instructions (Signed)
Hydrocodone as needed for pain.  Return to the ER or follow up with your doctor for any new problems.   Contusion A contusion is a deep bruise. Contusions are the result of an injury that caused bleeding under the skin. The contusion may turn blue, purple, or yellow. Minor injuries will give you a painless contusion, but more severe contusions may stay painful and swollen for a few weeks.  CAUSES  A contusion is usually caused by a blow, trauma, or direct force to an area of the body. SYMPTOMS   Swelling and redness of the injured area.  Bruising of the injured area.  Tenderness and soreness of the injured area.  Pain. DIAGNOSIS  The diagnosis can be made by taking a history and physical exam. An X-ray, CT scan, or MRI may be needed to determine if there were any associated injuries, such as fractures. TREATMENT  Specific treatment will depend on what area of the body was injured. In general, the best treatment for a contusion is resting, icing, elevating, and applying cold compresses to the injured area. Over-the-counter medicines may also be recommended for pain control. Ask your caregiver what the best treatment is for your contusion. HOME CARE INSTRUCTIONS   Put ice on the injured area.  Put ice in a plastic bag.  Place a towel between your skin and the bag.  Leave the ice on for 15-20 minutes, 03-04 times a day.  Only take over-the-counter or prescription medicines for pain, discomfort, or fever as directed by your caregiver. Your caregiver may recommend avoiding anti-inflammatory medicines (aspirin, ibuprofen, and naproxen) for 48 hours because these medicines may increase bruising.  Rest the injured area.  If possible, elevate the injured area to reduce swelling. SEEK IMMEDIATE MEDICAL CARE IF:   You have increased bruising or swelling.  You have pain that is getting worse.  Your swelling or pain is not relieved with medicines. MAKE SURE YOU:   Understand these  instructions.  Will watch your condition.  Will get help right away if you are not doing well or get worse. Document Released: 01/01/2005 Document Revised: 06/16/2011 Document Reviewed: 01/27/2011 [facility] Patient Information 2014 [place], [place].  Head Injury, Adult You have received a head injury. It does not appear serious at this time. Headaches and vomiting are common following head injury. It should be easy to awaken from sleeping. Sometimes it is necessary for you to stay in the emergency department for a while for observation. Sometimes admission to the hospital may be needed. After injuries such as yours, most problems occur within the first 24 hours, but side effects may occur up to 7 10 days after the injury. It is important for you to carefully monitor your condition and contact your health care provider or seek immediate medical care if there is a change in your condition. WHAT ARE THE TYPES OF HEAD INJURIES? Head injuries can be as minor as a bump. Some head injuries can be more severe. More severe head injuries include:  A jarring injury to the brain (concussion).  A bruise of the brain (contusion). This mean there is bleeding in the brain that can cause swelling.  A cracked skull (skull fracture).  Bleeding in the brain that collects, clots, and forms a bump (hematoma). WHAT CAUSES A HEAD INJURY? A serious head injury is most likely to happen to someone who is in a car wreck and is not wearing a seat belt. Other causes of major head injuries include bicycle or motorcycle  accidents, sports injuries, and falls. HOW ARE HEAD INJURIES DIAGNOSED? A complete history of the event leading to the injury and your current symptoms will be helpful in diagnosing head injuries. Many times, pictures of the brain, such as CT or MRI are needed to see the extent of the injury. Often, an overnight hospital stay is necessary for observation.  WHEN SHOULD I SEEK IMMEDIATE MEDICAL CARE?  You  should get help right away if:  You have confusion or drowsiness.  You feel sick to your stomach (nauseous) or have continued, forceful vomiting.  You have dizziness or unsteadiness that is getting worse.  You have severe, continued headaches not relieved by medicine. Only take over-the-counter or prescription medicines for pain, fever, or discomfort as directed by your health care provider.  You do not have normal function of the arms or legs or are unable to walk.  You notice changes in the black spots in the center of the colored part of your eye (pupil).  You have a clear or bloody fluid coming from your nose or ears.  You have a loss of vision. During the next 24 hours after the injury, you must stay with someone who can watch you for the warning signs. This person should contact local emergency services (911 in the [place] if you have seizures, you become unconscious, or you are unable to wake up. HOW CAN I PREVENT A HEAD INJURY IN THE FUTURE? The most important factor for preventing major head injuries is avoiding motor vehicle accidents. To minimize the potential for damage to your head, it is crucial to wear seat belts while riding in motor vehicles. Wearing helmets while bike riding and playing collision sports (like football) is also helpful. Also, avoiding dangerous activities around the house will further help reduce your risk of head injury.  WHEN CAN I RETURN TO NORMAL ACTIVITIES AND ATHLETICS? You should be reevaluated by your health care provider before returning to these activities. If you have any of the following symptoms, you should not return to activities or contact sports until 1 week after the symptoms have stopped:  Persistent headache.  Dizziness or vertigo.  Poor attention and concentration.  Confusion.  Memory problems.  Nausea or vomiting.  Fatigue or tire easily.  Irritability.  Intolerant of bright lights or loud noises.  Anxiety or  depression.  Disturbed sleep. MAKE SURE YOU:   Understand these instructions.  Will watch your condition.  Will get help right away if you are not doing well or get worse. Document Released: 03/24/2005 Document Revised: 01/12/2013 Document Reviewed: 11/29/2012 [facility] Patient Information 2014 [place].

## 2013-06-16 NOTE — ED Notes (Addendum)
Pt was at home and fell down 6 steps of carpet. Pt does not remember the fall.  Unknown loss of LOC.  Pt c/o bilateral shoulder and knee pain.  Denies any neck or back pain.

## 2013-06-16 NOTE — ED Notes (Signed)
Med student at bedside for evaluation

## 2013-06-16 NOTE — ED Provider Notes (Signed)
CSN: [id number]     Arrival date & time 06/16/13  1310 History   First MD Initiated Contact with Patient 06/16/13 1346     Chief Complaint  Patient presents with  . Fall     (Consider location/radiation/quality/duration/timing/severity/associated sxs/prior Treatment) HPI Comments: Patient is a 63 year old female presents with complaints of fall. She states she was walking down some stairs when her left knee "gave out on her". She fell forward striking her head on the railing. She denies loss of consciousness or headache but does have some amnesia to the event. She is complaining of pain in her left shoulder, hip, knee, and low back. She denies any chest pain or shortness of breath. She denies any abdominal pain.  Patient is a 63 y.o. female presenting with fall. The history is provided by the patient.  Fall This is a new problem. The current episode started 1 to 2 hours ago. The problem occurs constantly. The problem has not changed since onset.Pertinent negatives include no chest pain, no abdominal pain, no headaches and no shortness of breath. The symptoms are aggravated by walking. Nothing relieves the symptoms. She has tried nothing for the symptoms. The treatment provided no relief.    Past Medical History  Diagnosis Date  . Hypertension   . Diabetes mellitus without complication   . Asthma   . Nerve pain   . Back pain   . Leg pain   . Arthritis    Past Surgical History  Procedure Laterality Date  . Back surgery  2005    L4, L5 fused  . Thumb surgery- joint removed  2009    Arthritis  . Thyroid cyst excision  2009  . Abdominal hysterectomy  1981    one ovary/ tube removed  . Back surgery, 12/27/2012 l3 replaced cushion, l4/l5 fused  12/27/2012   Family History  Problem Relation Age of Onset  . Alzheimer's disease Father    History  Substance Use Topics  . Smoking status: Former Research scientist (life sciences)  . Smokeless tobacco: Never Used     Comment: Quit smoking 2007  . Alcohol Use:  No     Comment: stopped drinking 2009   OB History   Grav Para Term Preterm Abortions TAB SAB Ect Mult Living                 Review of Systems  Respiratory: Negative for shortness of breath.   Cardiovascular: Negative for chest pain.  Gastrointestinal: Negative for abdominal pain.  Neurological: Negative for headaches.  All other systems reviewed and are negative.      Allergies  Codeine  Home Medications   Current Outpatient Rx  Name  Route  Sig  Dispense  Refill  . amLODipine (NORVASC) 5 MG tablet   Oral   Take 5 mg by mouth daily.          [name] aspirin 81 MG tablet   Oral   Take 81 mg by mouth daily.         . benazepril (LOTENSIN) 10 MG tablet   Oral   Take 10 mg by mouth daily.          . cyclobenzaprine (FLEXERIL) 10 MG tablet   Oral   Take 10 mg by mouth 3 (three) times daily as needed for muscle spasms.          . fluticasone (FLONASE) 50 MCG/ACT nasal spray   Each Nare   Place 2 sprays into both nostrils daily.          [name]  Fluticasone-Salmeterol (ADVAIR) 100-50 MCG/DOSE AEPB   Inhalation   Inhale 1 puff into the lungs every 12 (twelve) hours.         . gabapentin (NEURONTIN) 300 MG capsule   Oral   Take 600 mg by mouth 3 (three) times daily.          . hydrochlorothiazide (HYDRODIURIL) 25 MG tablet   Oral   Take 25 mg by mouth daily.          [name] losartan-hydrochlorothiazide (HYZAAR) 100-25 MG per tablet   Oral   Take 1 tablet by mouth daily.         . meloxicam (MOBIC) 15 MG tablet   Oral   Take 15 mg by mouth daily.          . metFORMIN (GLUCOPHAGE) 500 MG tablet   Oral   Take 500 mg by mouth at bedtime.          . montelukast (SINGULAIR) 10 MG tablet   Oral   Take 10 mg by mouth at bedtime.          . pantoprazole (PROTONIX) 40 MG tablet      40 mg daily.          [name] tolterodine (DETROL LA) 2 MG 24 hr capsule   Oral   Take 2 capsules (4 mg total) by mouth daily.   30 capsule   3   . traMADol (ULTRAM)  50 MG tablet   Oral   Take 50 mg by mouth every 8 (eight) hours as needed for moderate pain. 1-2 as needed          BP 131/88  Pulse 88  Temp(Src) 98 F (36.7 C) (Oral)  Resp 18  SpO2 95% Physical Exam  Nursing note and vitals reviewed. Constitutional: She is oriented to person, place, and time. She appears well-developed and well-nourished. No distress.  HENT:  Head: Normocephalic and atraumatic.  Eyes: EOM are normal. Pupils are equal, round, and reactive to light.  Neck: Normal range of motion. Neck supple.  Cardiovascular: Normal rate, regular rhythm and normal heart sounds.   No murmur heard. Pulmonary/Chest: Effort normal and breath sounds normal. No respiratory distress. She has no wheezes.  Abdominal: Soft. Bowel sounds are normal. She exhibits no distension. There is no tenderness.  Musculoskeletal: Normal range of motion. She exhibits no edema.  There is tenderness to palpation over the lateral aspect of the left shoulder, left hip, and anterior aspect of left knee. There is no gross abnormality or deformity noted. Distal extremities have pulses, motor, and sensory all intact.  Neurological: She is alert and oriented to person, place, and time. No cranial nerve deficit. She exhibits normal muscle tone. Coordination normal.  Skin: Skin is warm and dry. She is not diaphoretic.    ED Course  Procedures (including critical care time) Labs Review Labs Reviewed - No data to display Imaging Review No results found.   EKG Interpretation None      MDM   Final diagnoses:  None    Patient presents after a fall.  CT of the head and plain films of the shoulder, knee, lumbar spine, and hip are all unremarkable.  At this point, she appears stable for discharge, to return prn for worsening symptoms.    Veryl Speak, MD 06/16/13 1538

## 2013-07-06 ENCOUNTER — Encounter: Payer: Self-pay | Admitting: Obstetrics & Gynecology

## 2013-07-06 ENCOUNTER — Ambulatory Visit (INDEPENDENT_AMBULATORY_CARE_PROVIDER_SITE_OTHER): Payer: Medicaid Other | Admitting: Obstetrics & Gynecology

## 2013-07-06 VITALS — BP 116/82 | HR 104 | Temp 97.3°F | Resp 20 | Ht 62.0 in | Wt 231.1 lb

## 2013-07-06 DIAGNOSIS — N3281 Overactive bladder: Secondary | ICD-10-CM

## 2013-07-06 DIAGNOSIS — N318 Other neuromuscular dysfunction of bladder: Secondary | ICD-10-CM

## 2013-07-06 MED ORDER — OXYBUTYNIN CHLORIDE 5 MG PO TABS: 5.0000 mg | ORAL_TABLET | Freq: Two times a day (BID) | ORAL | Status: DC

## 2013-07-06 NOTE — Progress Notes (Signed)
Subjective:     Patient ID: Pamela Dalton, female   DOB: [birth year], 63 y.o.   MRN: [id number]  HPI Pt changed to Ditropan 5mg  for OAB.  She reports that she is not voiding as much overnight (pt takes her meds at night) however, she reports urgency while at home and says that it makes it difficult to go out.  She reports dry mouth but feels that she is dealing with it.   Review of Systems     Objective:   Physical Exam BP 116/82  Pulse 104  Temp(Src) 97.3 F (36.3 C) (Oral)  Resp 20  Ht 5\' 2"  (1.575 m)  Wt 231 lb 1.6 oz (104.826 kg)  BMI 42.26 kg/m2 Pt in NAD Exam deferred     Assessment:     OAB- improved somewhat on Ditropan 5mg         Plan:     Ditropan 5mg  bid F/u via telephone if sx not improving adequately to get dosage increased   F/u in 3 months of sx improved

## 2013-07-06 NOTE — Patient Instructions (Signed)
Overactive Bladder, Adult The bladder has two functions that are totally opposite of the other. One is to relax and stretch out so it can store urine (fills like a balloon), and the other is to contract and squeeze down so that it can empty the urine that it has stored. Proper functioning of the bladder is a complex mixing of these two functions. The filling and emptying of the bladder can be influenced by:  The bladder.  The spinal cord.  The brain.  The nerves going to the bladder.  Other organs that are closely related to the bladder such as prostate in males and the vagina in females. As your bladder fills with urine, nerve signals are sent from the bladder to the brain to tell you that you may need to urinate. Normal urination requires that the bladder squeeze down with sufficient strength to empty the bladder, but this also requires that the bladder squeeze down sufficiently long to finish the job. In addition the sphincter muscles, which normally keep you from leaking urine, must also relax so that the urine can pass. Coordination between the bladder muscle squeezing down and the sphincter muscles relaxing is required to make everything happen normally. With an overactive bladder sometimes the muscles of the bladder contract unexpectedly and involuntarily and this causes an urgent need to urinate. The normal response is to try to hold urine in by contracting the sphincter muscles. Sometimes the bladder contracts so strongly that the sphincter muscles cannot stop the urine from passing out and incontinence occurs. This kind of incontinence is called urge incontinence. Having an overactive bladder can be embarrassing and awkward. It can keep you from living life the way you want to. Many people think it is just something you have to put up with as you grow older or have certain health conditions. In fact, there are treatments that can help make your life easier and more pleasant. CAUSES  Many  things can cause an overactive bladder. Possibilities include:  Urinary tract infection or infection of nearby tissues such as the prostate.  Prostate enlargement.  In women, multiple pregnancies or surgery on the uterus or urethra.  Bladder stones, inflammation or tumors.  Caffeine.  Alcohol.  Medications. For example, diuretics (drugs that help the body get rid of extra fluid) increase urine production. Some other medicines must be taken with lots of fluids.  Muscle or nerve weakness. This might be the result of a spinal cord injury, a stroke, multiple sclerosis or Parkinson's disease.  Diabetes can cause a high urine volume which fills the bladder so quickly that the normal urge to urinate is triggered very strongly. SYMPTOMS   Loss of bladder control. You feel the need to urinate and cannot make your body wait.  Sudden, strong urges to urinate.  Urinating 8 or more times a day.  Waking up to urinate two or more times a night. DIAGNOSIS  To decide if you have overactive bladder, your healthcare provider will probably:  Ask about symptoms you have noticed.  Ask about your overall health. This will include questions about any medications you are taking.  Do a physical examination. This will help determine if there are obvious blockages or other problems.  Order some tests. These might include:  A blood test to check for diabetes or other health issues that could be contributing to the problem.  Urine testing. This could measure the flow of urine and the pressure on the bladder.  A test of your neurological   system (the brain, spinal cord and nerves). This is the system that senses the need to urinate. Some of these tests are called flow tests, bladder pressure tests and electrical measurements of the sphincter muscle.  A bladder test to check whether it is emptying completely when you urinate.  Cytoscopy. This test uses a thin tube with a tiny camera on it. It offers a  look inside your urethra and bladder to see if there are problems.  Imaging tests. You might be given a contrast dye and then asked to urinate. X-rays are taken to see how your bladder is working. TREATMENT  An overactive bladder can be treated in many ways. The treatment will depend on the cause. Whether you have a mild or severe case also makes a difference. Often, treatment can be given in your healthcare provider's office or clinic. Be sure to discuss the different options with your caregiver. They include:  Behavioral treatments. These do not involve medication or surgery:  Bladder training. For this, you would follow a schedule to urinate at regular intervals. This helps you learn to control the urge to urinate. At first, you might be asked to wait a few minutes after feeling the urge. In time, you should be able to schedule bathroom visits an hour or more apart.  Kegel exercises. These exercises strengthen the pelvic floor muscles, which support the bladder. By toning these muscles, they can help control urination, even if the bladder muscles are overactive. A specialist will teach you how to do these exercises correctly. They will require daily practice.  Weight loss. If you are obese or overweight, losing weight might stop your bladder from being overactive. Talk to your healthcare provider about how many pounds you should lose. Also ask if there is a specific program or method that would work best for you.  Diet change. This might be suggested if constipation is making your overactive bladder worse. Your healthcare provider or a nutritionist can explain ways to change what you eat to ease constipation. Other people might need to take in less caffeine or alcohol. Sometimes drinking fewer fluids is needed, too.  Protection. This is not an actual treatment. But, you could wear special pads to take care of any leakage while you wait for other treatments to take effect. This will help you avoid  embarrassment.  Physical treatments.  Electrical stimulation. Electrodes will send gentle pulses to the nerves or muscles that help control the bladder. The goal is to strengthen them. Sometimes this is done with the electrodes outside of the body. Or, they might be placed inside the body (implanted). This treatment can take several months to have an effect.  Medications. These are usually used along with other treatments. Several medicines are available. Some are injected into the muscles involved in urination. Others come in pill form. Medications sometimes prescribed include:  Anticholinergics. These drugs block the signals that the nerves deliver to the bladder. This keeps it from releasing urine at the wrong time. Researchers think the drugs might help in other ways, too.  Imipramine. This is an antidepressant. But, it relaxes bladder muscles.  Botox. This is still experimental. Some people believe that injecting it into the bladder muscles will relax them so they work more normally. It has also been injected into the sphincter muscle when the sphincter muscle does not open properly. This is a temporary fix, however. Also, it might make matters worse, especially in older people.  Surgery.  A device might be implanted  bladder muscles will relax them so they work more normally. It has also been injected into the sphincter muscle when the sphincter muscle does not open properly. This is a temporary fix, however. Also, it might make matters worse, especially in older people.  · Surgery.  · A device might be implanted to help manage your nerves. It works on the nerves that signal when you need to urinate.  · Surgery is sometimes needed with electrical stimulation. If the electrodes are implanted, this is done through surgery.  · Sometimes repairs need to be made through surgery. For example, the size of the bladder can be changed. This is usually done in severe cases only.  HOME CARE INSTRUCTIONS   · Take any medications your healthcare provider prescribed or suggested. Follow the directions carefully.  · Practice any lifestyle changes that are recommended. These might include:  · Drinking less fluid or drinking at different times of the day. If you need to urinate often during the night, for  example, you may need to stop drinking fluids early in the evening.  · Cutting down on caffeine or alcohol. They can both make an overactive bladder worse. Caffeine is found in coffee, tea and sodas.  · Doing Kegel exercises to strengthen muscles.  · Losing weight, if that is recommended.  · Eating a healthy and balanced diet. This will help you avoid constipation.  · Keep a journal or a log. You might be asked to record how much you drink and when, and also when you feel the need to urinate.  · Learn how to care for implants or other devices, such as pessaries.  SEEK MEDICAL CARE IF:   · Your overactive bladder gets worse.  · You feel increased pain or irritation when you urinate.  · You notice blood in your urine.  · You have questions about any medications or devices that your healthcare provider recommended.  · You notice blood, pus or swelling at the site of any test or treatment procedure.  · You have an oral temperature above 102° F (38.9° C).  SEEK IMMEDIATE MEDICAL CARE IF:   You have an oral temperature above 102° F (38.9° C), not controlled by medicine.  Document Released: 01/18/2009 Document Revised: 06/16/2011 Document Reviewed: 01/18/2009  [employer]® Patient Information ©2014 [employer].

## 2013-07-12 ENCOUNTER — Ambulatory Visit: Payer: PRIVATE HEALTH INSURANCE | Attending: Family Medicine

## 2013-07-12 DIAGNOSIS — M6281 Muscle weakness (generalized): Secondary | ICD-10-CM | POA: Insufficient documentation

## 2013-07-12 DIAGNOSIS — R5381 Other malaise: Secondary | ICD-10-CM | POA: Diagnosis not present

## 2013-07-12 DIAGNOSIS — IMO0001 Reserved for inherently not codable concepts without codable children: Secondary | ICD-10-CM | POA: Insufficient documentation

## 2013-07-12 DIAGNOSIS — M25519 Pain in unspecified shoulder: Secondary | ICD-10-CM | POA: Diagnosis not present

## 2013-07-12 DIAGNOSIS — M25619 Stiffness of unspecified shoulder, not elsewhere classified: Secondary | ICD-10-CM | POA: Diagnosis not present

## 2013-07-15 ENCOUNTER — Ambulatory Visit: Payer: PRIVATE HEALTH INSURANCE

## 2013-07-19 ENCOUNTER — Ambulatory Visit: Payer: PRIVATE HEALTH INSURANCE

## 2013-07-19 DIAGNOSIS — IMO0001 Reserved for inherently not codable concepts without codable children: Secondary | ICD-10-CM | POA: Diagnosis not present

## 2013-07-26 ENCOUNTER — Ambulatory Visit: Payer: PRIVATE HEALTH INSURANCE

## 2013-07-26 DIAGNOSIS — IMO0001 Reserved for inherently not codable concepts without codable children: Secondary | ICD-10-CM | POA: Diagnosis not present

## 2013-07-28 ENCOUNTER — Ambulatory Visit: Payer: PRIVATE HEALTH INSURANCE | Admitting: Physical Therapy

## 2013-07-28 DIAGNOSIS — IMO0001 Reserved for inherently not codable concepts without codable children: Secondary | ICD-10-CM | POA: Diagnosis not present

## 2013-08-02 ENCOUNTER — Ambulatory Visit: Payer: PRIVATE HEALTH INSURANCE

## 2013-08-02 DIAGNOSIS — IMO0001 Reserved for inherently not codable concepts without codable children: Secondary | ICD-10-CM | POA: Diagnosis not present

## 2013-08-03 ENCOUNTER — Other Ambulatory Visit: Payer: Self-pay | Admitting: Family Medicine

## 2013-08-04 ENCOUNTER — Ambulatory Visit: Payer: PRIVATE HEALTH INSURANCE

## 2013-08-04 DIAGNOSIS — IMO0001 Reserved for inherently not codable concepts without codable children: Secondary | ICD-10-CM | POA: Diagnosis not present

## 2013-08-09 ENCOUNTER — Ambulatory Visit: Payer: PRIVATE HEALTH INSURANCE | Attending: Internal Medicine

## 2013-08-09 DIAGNOSIS — M25519 Pain in unspecified shoulder: Secondary | ICD-10-CM | POA: Diagnosis not present

## 2013-08-09 DIAGNOSIS — R5381 Other malaise: Secondary | ICD-10-CM | POA: Diagnosis not present

## 2013-08-09 DIAGNOSIS — M25619 Stiffness of unspecified shoulder, not elsewhere classified: Secondary | ICD-10-CM | POA: Diagnosis not present

## 2013-08-09 DIAGNOSIS — M6281 Muscle weakness (generalized): Secondary | ICD-10-CM | POA: Insufficient documentation

## 2013-08-09 DIAGNOSIS — IMO0001 Reserved for inherently not codable concepts without codable children: Secondary | ICD-10-CM | POA: Diagnosis present

## 2013-08-11 ENCOUNTER — Ambulatory Visit: Payer: PRIVATE HEALTH INSURANCE

## 2013-08-11 DIAGNOSIS — IMO0001 Reserved for inherently not codable concepts without codable children: Secondary | ICD-10-CM | POA: Diagnosis not present

## 2013-08-17 ENCOUNTER — Other Ambulatory Visit: Payer: Self-pay | Admitting: Family Medicine

## 2013-08-23 ENCOUNTER — Telehealth: Payer: Self-pay | Admitting: *Deleted

## 2013-08-23 NOTE — Telephone Encounter (Signed)
Pt left message on nurse voice mail and stated that she was calling for Dr. Tamala Julian. She wants her to know that the new medication is working great and she really appreciates the time the doctor took to figure out a medication that would help her. She continued by 40 love y'all there and continue to do the great work for all of [place] womenfolk because we really do need women who understand."

## 2013-09-02 ENCOUNTER — Other Ambulatory Visit: Payer: Self-pay | Admitting: Family Medicine

## 2013-10-13 ENCOUNTER — Other Ambulatory Visit: Payer: Self-pay | Admitting: Family Medicine

## 2013-10-13 DIAGNOSIS — Z1231 Encounter for screening mammogram for malignant neoplasm of breast: Secondary | ICD-10-CM

## 2013-10-24 ENCOUNTER — Other Ambulatory Visit: Payer: Self-pay | Admitting: Orthopedic Surgery

## 2013-10-26 ENCOUNTER — Encounter (HOSPITAL_BASED_OUTPATIENT_CLINIC_OR_DEPARTMENT_OTHER): Payer: Self-pay | Admitting: *Deleted

## 2013-10-26 NOTE — Progress Notes (Signed)
10/26/13 1154  OBSTRUCTIVE SLEEP APNEA  Have you ever been diagnosed with sleep apnea through a sleep study? No  Do you snore loudly (loud enough to be heard through closed doors)?  0  Do you often feel tired, fatigued, or sleepy during the daytime? 0  Has anyone observed you stop breathing during your sleep? 0  Do you have, or are you being treated for high blood pressure? 1  BMI more than 35 kg/m2? 1  Age over 62 years old? 1  Neck circumference greater than 40 cm/16 inches? 1  Gender: 0  Obstructive Sleep Apnea Score 4  Score 4 or greater  Results sent to PCP

## 2013-10-26 NOTE — Progress Notes (Signed)
Will come in for ekg-bmet-bring all meds and overnight bag in case she has to stay-denies sleep apnea

## 2013-10-28 ENCOUNTER — Encounter (HOSPITAL_BASED_OUTPATIENT_CLINIC_OR_DEPARTMENT_OTHER)
Admission: RE | Admit: 2013-10-28 | Discharge: 2013-10-28 | Disposition: A | Discharge status: Home / Self Care | Payer: PRIVATE HEALTH INSURANCE | Source: Ambulatory Visit | Attending: Orthopedic Surgery | Admitting: Orthopedic Surgery

## 2013-10-28 ENCOUNTER — Telehealth: Payer: Self-pay

## 2013-10-28 DIAGNOSIS — E669 Obesity, unspecified: Secondary | ICD-10-CM | POA: Diagnosis not present

## 2013-10-28 DIAGNOSIS — Z87891 Personal history of nicotine dependence: Secondary | ICD-10-CM | POA: Diagnosis not present

## 2013-10-28 DIAGNOSIS — I10 Essential (primary) hypertension: Secondary | ICD-10-CM | POA: Diagnosis not present

## 2013-10-28 DIAGNOSIS — K219 Gastro-esophageal reflux disease without esophagitis: Secondary | ICD-10-CM | POA: Diagnosis not present

## 2013-10-28 DIAGNOSIS — Z7982 Long term (current) use of aspirin: Secondary | ICD-10-CM | POA: Diagnosis not present

## 2013-10-28 DIAGNOSIS — J45909 Unspecified asthma, uncomplicated: Secondary | ICD-10-CM | POA: Diagnosis not present

## 2013-10-28 DIAGNOSIS — N3281 Overactive bladder: Secondary | ICD-10-CM

## 2013-10-28 DIAGNOSIS — S43429A Sprain of unspecified rotator cuff capsule, initial encounter: Secondary | ICD-10-CM | POA: Diagnosis present

## 2013-10-28 DIAGNOSIS — M24019 Loose body in unspecified shoulder: Secondary | ICD-10-CM | POA: Diagnosis not present

## 2013-10-28 DIAGNOSIS — Z79899 Other long term (current) drug therapy: Secondary | ICD-10-CM | POA: Diagnosis not present

## 2013-10-28 DIAGNOSIS — E119 Type 2 diabetes mellitus without complications: Secondary | ICD-10-CM | POA: Diagnosis not present

## 2013-10-28 DIAGNOSIS — M719 Bursopathy, unspecified: Secondary | ICD-10-CM | POA: Diagnosis not present

## 2013-10-28 DIAGNOSIS — M67919 Unspecified disorder of synovium and tendon, unspecified shoulder: Secondary | ICD-10-CM | POA: Diagnosis not present

## 2013-10-28 LAB — BASIC METABOLIC PANEL
Anion gap: 15 (ref 5–15)
BUN: 15 mg/dL (ref 6–23)
CALCIUM: 10.4 mg/dL (ref 8.4–10.5)
CO2: 24 mEq/L (ref 19–32)
CREATININE: 0.75 mg/dL (ref 0.50–1.10)
Chloride: 102 mEq/L (ref 96–112)
GFR calc Af Amer: >90 mL/min (ref >90)
GFR calc non Af Amer: 89 mL/min — ABNORMAL LOW (ref >90)
GLUCOSE: 87 mg/dL (ref 70–99)
Potassium: 4.3 mEq/L (ref 3.7–5.3)
Sodium: 141 mEq/L (ref 137–147)

## 2013-10-28 MED ORDER — OXYBUTYNIN CHLORIDE 5 MG PO TABS: 5.0000 mg | ORAL_TABLET | Freq: Two times a day (BID) | ORAL | Status: DC

## 2013-10-28 NOTE — Telephone Encounter (Signed)
Patient called stating she needs a refill of her ditropan. Per chart review, patient was to follow up 3 months after April visit. No appointment scheduled. Spoke to Dr. Roselie Awkward who agreed to refill Rx with condition that patient gets appointment for follow up. Called patient and informed her that there are refills but that I would pass her information to the front desk and they will call her with an appointment. Patient verbalized understanding and gratitude. No questions or concerns.

## 2013-10-31 ENCOUNTER — Encounter (HOSPITAL_BASED_OUTPATIENT_CLINIC_OR_DEPARTMENT_OTHER): Payer: Self-pay

## 2013-10-31 ENCOUNTER — Ambulatory Visit (HOSPITAL_BASED_OUTPATIENT_CLINIC_OR_DEPARTMENT_OTHER)
Admission: RE | Admit: 2013-10-31 | Discharge: 2013-10-31 | Disposition: A | Discharge status: Home / Self Care | Payer: PRIVATE HEALTH INSURANCE | Source: Ambulatory Visit | Attending: Orthopedic Surgery | Admitting: Orthopedic Surgery

## 2013-10-31 ENCOUNTER — Ambulatory Visit (HOSPITAL_BASED_OUTPATIENT_CLINIC_OR_DEPARTMENT_OTHER): Payer: PRIVATE HEALTH INSURANCE | Admitting: Anesthesiology

## 2013-10-31 ENCOUNTER — Encounter (HOSPITAL_BASED_OUTPATIENT_CLINIC_OR_DEPARTMENT_OTHER): Payer: PRIVATE HEALTH INSURANCE | Admitting: Anesthesiology

## 2013-10-31 ENCOUNTER — Encounter (HOSPITAL_BASED_OUTPATIENT_CLINIC_OR_DEPARTMENT_OTHER)
Admission: RE | Disposition: A | Discharge status: Home / Self Care | Payer: Self-pay | Source: Ambulatory Visit | Attending: Orthopedic Surgery

## 2013-10-31 DIAGNOSIS — M719 Bursopathy, unspecified: Secondary | ICD-10-CM | POA: Diagnosis not present

## 2013-10-31 DIAGNOSIS — E669 Obesity, unspecified: Secondary | ICD-10-CM | POA: Insufficient documentation

## 2013-10-31 DIAGNOSIS — Z7982 Long term (current) use of aspirin: Secondary | ICD-10-CM | POA: Insufficient documentation

## 2013-10-31 DIAGNOSIS — E119 Type 2 diabetes mellitus without complications: Secondary | ICD-10-CM | POA: Insufficient documentation

## 2013-10-31 DIAGNOSIS — Z79899 Other long term (current) drug therapy: Secondary | ICD-10-CM | POA: Insufficient documentation

## 2013-10-31 DIAGNOSIS — M67919 Unspecified disorder of synovium and tendon, unspecified shoulder: Secondary | ICD-10-CM | POA: Insufficient documentation

## 2013-10-31 DIAGNOSIS — M75121 Complete rotator cuff tear or rupture of right shoulder, not specified as traumatic: Secondary | ICD-10-CM

## 2013-10-31 DIAGNOSIS — I10 Essential (primary) hypertension: Secondary | ICD-10-CM | POA: Insufficient documentation

## 2013-10-31 DIAGNOSIS — J45909 Unspecified asthma, uncomplicated: Secondary | ICD-10-CM | POA: Insufficient documentation

## 2013-10-31 DIAGNOSIS — M24019 Loose body in unspecified shoulder: Secondary | ICD-10-CM | POA: Diagnosis not present

## 2013-10-31 DIAGNOSIS — K219 Gastro-esophageal reflux disease without esophagitis: Secondary | ICD-10-CM | POA: Insufficient documentation

## 2013-10-31 DIAGNOSIS — Z87891 Personal history of nicotine dependence: Secondary | ICD-10-CM | POA: Insufficient documentation

## 2013-10-31 HISTORY — DX: Gastro-esophageal reflux disease without esophagitis: K21.9

## 2013-10-31 HISTORY — PX: SHOULDER ARTHROSCOPY WITH ROTATOR CUFF REPAIR AND SUBACROMIAL DECOMPRESSION: SHX5686

## 2013-10-31 HISTORY — DX: Complete loss of teeth, unspecified cause, unspecified class: Z97.2

## 2013-10-31 HISTORY — DX: Complete loss of teeth, unspecified cause, unspecified class: K08.109

## 2013-10-31 HISTORY — DX: Presence of spectacles and contact lenses: Z97.3

## 2013-10-31 HISTORY — DX: Reserved for concepts with insufficient information to code with codable children: IMO0002

## 2013-10-31 LAB — GLUCOSE, CAPILLARY
GLUCOSE-CAPILLARY: 123 mg/dL — AB (ref 70–99)
Glucose-Capillary: 149 mg/dL — ABNORMAL HIGH (ref 70–99)

## 2013-10-31 LAB — POCT HEMOGLOBIN-HEMACUE: Hemoglobin: 13.5 g/dL (ref 12.0–15.0)

## 2013-10-31 SURGERY — SHOULDER ARTHROSCOPY WITH ROTATOR CUFF REPAIR AND SUBACROMIAL DECOMPRESSION
Anesthesia: General | Site: Shoulder | Laterality: Right

## 2013-10-31 MED ORDER — CEFAZOLIN SODIUM-DEXTROSE 2-3 GM-% IV SOLR
2.0000 g | INTRAVENOUS | Status: AC
  Administered 2013-10-31: 2 g via INTRAVENOUS

## 2013-10-31 MED ORDER — PROPOFOL 10 MG/ML IV BOLUS
INTRAVENOUS | Status: DC | PRN
  Administered 2013-10-31: 200 mg via INTRAVENOUS

## 2013-10-31 MED ORDER — OXYCODONE-ACETAMINOPHEN 5-325 MG PO TABS: 1.0000 | ORAL_TABLET | ORAL | Status: DC | PRN

## 2013-10-31 MED ORDER — MIDAZOLAM HCL 2 MG/2ML IJ SOLN
INTRAMUSCULAR | Status: AC
  Filled 2013-10-31: qty 2

## 2013-10-31 MED ORDER — LIDOCAINE HCL (CARDIAC) 20 MG/ML IV SOLN
INTRAVENOUS | Status: DC | PRN
  Administered 2013-10-31: 80 mg via INTRAVENOUS

## 2013-10-31 MED ORDER — OXYCODONE HCL 5 MG PO TABS
ORAL_TABLET | ORAL | Status: AC
  Filled 2013-10-31: qty 1

## 2013-10-31 MED ORDER — LACTATED RINGERS IV SOLN
INTRAVENOUS | Status: DC
  Administered 2013-10-31: 12:00:00 via INTRAVENOUS

## 2013-10-31 MED ORDER — FENTANYL CITRATE 0.05 MG/ML IJ SOLN
50.0000 ug | INTRAMUSCULAR | Status: DC | PRN
  Administered 2013-10-31: 100 ug via INTRAVENOUS

## 2013-10-31 MED ORDER — FENTANYL CITRATE 0.05 MG/ML IJ SOLN
INTRAMUSCULAR | Status: AC
  Filled 2013-10-31: qty 6

## 2013-10-31 MED ORDER — MIDAZOLAM HCL 2 MG/2ML IJ SOLN
1.0000 mg | INTRAMUSCULAR | Status: DC | PRN
  Administered 2013-10-31: 2 mg via INTRAVENOUS

## 2013-10-31 MED ORDER — ONDANSETRON HCL 4 MG/2ML IJ SOLN: 4.0000 mg | Freq: Once | INTRAMUSCULAR | Status: DC | PRN

## 2013-10-31 MED ORDER — BUPIVACAINE HCL (PF) 0.5 % IJ SOLN
INTRAMUSCULAR | Status: AC
Start: 2013-10-31 — End: 2013-10-31
  Filled 2013-10-31: qty 30

## 2013-10-31 MED ORDER — BUPIVACAINE-EPINEPHRINE (PF) 0.5% -1:200000 IJ SOLN
INTRAMUSCULAR | Status: DC | PRN
  Administered 2013-10-31: 22 mL via PERINEURAL

## 2013-10-31 MED ORDER — PHENYLEPHRINE HCL 10 MG/ML IJ SOLN
10.0000 mg | INTRAVENOUS | Status: DC | PRN
  Administered 2013-10-31: 50 ug/min via INTRAVENOUS

## 2013-10-31 MED ORDER — FENTANYL CITRATE 0.05 MG/ML IJ SOLN
INTRAMUSCULAR | Status: AC
  Filled 2013-10-31: qty 2

## 2013-10-31 MED ORDER — ONDANSETRON HCL 4 MG/2ML IJ SOLN
INTRAMUSCULAR | Status: DC | PRN
  Administered 2013-10-31: 4 mg via INTRAVENOUS

## 2013-10-31 MED ORDER — FENTANYL CITRATE 0.05 MG/ML IJ SOLN
INTRAMUSCULAR | Status: DC | PRN
  Administered 2013-10-31: 100 ug via INTRAVENOUS

## 2013-10-31 MED ORDER — HYDROMORPHONE HCL PF 1 MG/ML IJ SOLN
0.2500 mg | INTRAMUSCULAR | Status: DC | PRN
  Administered 2013-10-31 (×3): 0.5 mg via INTRAVENOUS

## 2013-10-31 MED ORDER — SUCCINYLCHOLINE CHLORIDE 20 MG/ML IJ SOLN
INTRAMUSCULAR | Status: DC | PRN
  Administered 2013-10-31: 100 mg via INTRAVENOUS

## 2013-10-31 MED ORDER — OXYCODONE HCL 5 MG/5ML PO SOLN: 5.0000 mg | Freq: Once | ORAL | Status: AC | PRN

## 2013-10-31 MED ORDER — HYDROMORPHONE HCL PF 1 MG/ML IJ SOLN
INTRAMUSCULAR | Status: AC
  Filled 2013-10-31: qty 1

## 2013-10-31 MED ORDER — DOCUSATE SODIUM 100 MG PO CAPS: 100.0000 mg | ORAL_CAPSULE | Freq: Three times a day (TID) | ORAL | Status: DC | PRN

## 2013-10-31 MED ORDER — CEFAZOLIN SODIUM-DEXTROSE 2-3 GM-% IV SOLR
INTRAVENOUS | Status: AC
  Filled 2013-10-31: qty 50

## 2013-10-31 MED ORDER — SODIUM CHLORIDE 0.9 % IR SOLN
Status: DC | PRN
  Administered 2013-10-31: 3000 mL

## 2013-10-31 MED ORDER — OXYCODONE HCL 5 MG PO TABS
5.0000 mg | ORAL_TABLET | Freq: Once | ORAL | Status: AC | PRN
  Administered 2013-10-31: 5 mg via ORAL

## 2013-10-31 MED ORDER — POVIDONE-IODINE 7.5 % EX SOLN: Freq: Once | CUTANEOUS | Status: DC

## 2013-10-31 SURGICAL SUPPLY — 87 items
ADH SKN CLS APL DERMABOND .7 (GAUZE/BANDAGES/DRESSINGS)
APL SKNCLS STERI-STRIP NONHPOA (GAUZE/BANDAGES/DRESSINGS)
BENZOIN TINCTURE PRP APPL 2/3 (GAUZE/BANDAGES/DRESSINGS) IMPLANT
BLADE SURG 15 STRL LF DISP TIS (BLADE) IMPLANT
BLADE SURG 15 STRL SS (BLADE)
BLADE SURG ROTATE 9660 (MISCELLANEOUS) IMPLANT
BUR OVAL 4.0 (BURR) ×3 IMPLANT
CANISTER SUCT 3000ML (MISCELLANEOUS) IMPLANT
CANNULA 5.75X71 LONG (CANNULA) ×3 IMPLANT
CANNULA TWIST IN 8.25X7CM (CANNULA) IMPLANT
CHLORAPREP W/TINT 26ML (MISCELLANEOUS) ×3 IMPLANT
CLOSURE WOUND 1/2 X4 (GAUZE/BANDAGES/DRESSINGS)
DECANTER SPIKE VIAL GLASS SM (MISCELLANEOUS) IMPLANT
DERMABOND ADVANCED (GAUZE/BANDAGES/DRESSINGS)
DERMABOND ADVANCED .7 DNX12 (GAUZE/BANDAGES/DRESSINGS) IMPLANT
DRAPE INCISE IOBAN 66X45 STRL (DRAPES) ×3 IMPLANT
DRAPE STERI 35X30 U-POUCH (DRAPES) ×3 IMPLANT
DRAPE SURG 17X23 STRL (DRAPES) ×3 IMPLANT
DRAPE U 20/CS (DRAPES) ×3 IMPLANT
DRAPE U-SHAPE 47X51 STRL (DRAPES) ×3 IMPLANT
DRAPE U-SHAPE 76X120 STRL (DRAPES) ×6 IMPLANT
DRSG PAD ABDOMINAL 8X10 ST (GAUZE/BANDAGES/DRESSINGS) ×3 IMPLANT
ELECT REM PT RETURN 9FT ADLT (ELECTROSURGICAL) ×3
ELECTRODE REM PT RTRN 9FT ADLT (ELECTROSURGICAL) ×1 IMPLANT
GAUZE SPONGE 4X4 12PLY STRL (GAUZE/BANDAGES/DRESSINGS) ×3 IMPLANT
GAUZE SPONGE 4X4 16PLY XRAY LF (GAUZE/BANDAGES/DRESSINGS) IMPLANT
GAUZE XEROFORM 1X8 LF (GAUZE/BANDAGES/DRESSINGS) ×3 IMPLANT
GLOVE BIO SURGEON STRL SZ7 (GLOVE) ×3 IMPLANT
GLOVE BIO SURGEON STRL SZ7.5 (GLOVE) ×3 IMPLANT
GLOVE BIOGEL PI IND STRL 7.0 (GLOVE) ×1 IMPLANT
GLOVE BIOGEL PI IND STRL 8 (GLOVE) ×1 IMPLANT
GLOVE BIOGEL PI INDICATOR 7.0 (GLOVE) ×4
GLOVE BIOGEL PI INDICATOR 8 (GLOVE) ×2
GLOVE ECLIPSE 6.5 STRL STRAW (GLOVE) ×2 IMPLANT
GOWN STRL REUS W/ TWL LRG LVL3 (GOWN DISPOSABLE) ×2 IMPLANT
GOWN STRL REUS W/ TWL XL LVL3 (GOWN DISPOSABLE) ×1 IMPLANT
GOWN STRL REUS W/TWL LRG LVL3 (GOWN DISPOSABLE) ×6
GOWN STRL REUS W/TWL XL LVL3 (GOWN DISPOSABLE) ×3
MANIFOLD NEPTUNE II (INSTRUMENTS) ×3 IMPLANT
NDL 1/2 CIR CATGUT .05X1.09 (NEEDLE) IMPLANT
NDL SCORPION MULTI FIRE (NEEDLE) IMPLANT
NDL SUT 6 .5 CRC .975X.05 MAYO (NEEDLE) IMPLANT
NEEDLE 1/2 CIR CATGUT .05X1.09 (NEEDLE) IMPLANT
NEEDLE MAYO TAPER (NEEDLE)
NEEDLE SCORPION MULTI FIRE (NEEDLE) IMPLANT
NS IRRIG 1000ML POUR BTL (IV SOLUTION) IMPLANT
PACK ARTHROSCOPY DSU (CUSTOM PROCEDURE TRAY) ×3 IMPLANT
PACK BASIN DAY SURGERY FS (CUSTOM PROCEDURE TRAY) ×3 IMPLANT
PENCIL BUTTON HOLSTER BLD 10FT (ELECTRODE) IMPLANT
RESECTOR FULL RADIUS 4.2MM (BLADE) ×3 IMPLANT
SHEET MEDIUM DRAPE 40X70 STRL (DRAPES) IMPLANT
SLEEVE SCD COMPRESS KNEE MED (MISCELLANEOUS) ×3 IMPLANT
SLING ARM IMMOBILIZER MED (SOFTGOODS) IMPLANT
SLING ARM LRG ADULT FOAM STRAP (SOFTGOODS) IMPLANT
SLING ARM MED ADULT FOAM STRAP (SOFTGOODS) ×2 IMPLANT
SLING ARM XL FOAM STRAP (SOFTGOODS) IMPLANT
SPONGE LAP 4X18 X RAY DECT (DISPOSABLE) IMPLANT
STRIP CLOSURE SKIN 1/2X4 (GAUZE/BANDAGES/DRESSINGS) IMPLANT
SUCTION FRAZIER TIP 10 FR DISP (SUCTIONS) IMPLANT
SUPPORT WRAP ARM LG (MISCELLANEOUS) ×2 IMPLANT
SUT BONE WAX W31G (SUTURE) IMPLANT
SUT ETHIBOND 2 OS 4 DA (SUTURE) IMPLANT
SUT ETHILON 3 0 PS 1 (SUTURE) ×3 IMPLANT
SUT ETHILON 4 0 PS 2 18 (SUTURE) IMPLANT
SUT FIBERWIRE #2 38 T-5 BLUE (SUTURE)
SUT FIBERWIRE 2-0 18 17.9 3/8 (SUTURE)
SUT MNCRL AB 3-0 PS2 18 (SUTURE) IMPLANT
SUT MNCRL AB 4-0 PS2 18 (SUTURE) IMPLANT
SUT PDS AB 0 CT 36 (SUTURE) IMPLANT
SUT PROLENE 3 0 PS 2 (SUTURE) IMPLANT
SUT TIGER TAPE 7 IN WHITE (SUTURE) IMPLANT
SUT VIC AB 0 CT1 27 (SUTURE)
SUT VIC AB 0 CT1 27XBRD ANBCTR (SUTURE) IMPLANT
SUT VIC AB 2-0 SH 27 (SUTURE)
SUT VIC AB 2-0 SH 27XBRD (SUTURE) IMPLANT
SUTURE FIBERWR #2 38 T-5 BLUE (SUTURE) IMPLANT
SUTURE FIBERWR 2-0 18 17.9 3/8 (SUTURE) IMPLANT
SYR BULB 3OZ (MISCELLANEOUS) IMPLANT
TAPE FIBER 2MM 7IN #2 BLUE (SUTURE) IMPLANT
TOWEL OR 17X24 6PK STRL BLUE (TOWEL DISPOSABLE) ×3 IMPLANT
TOWEL OR NON WOVEN STRL DISP B (DISPOSABLE) ×3 IMPLANT
TUBE CONNECTING 20'X1/4 (TUBING) ×1
TUBE CONNECTING 20X1/4 (TUBING) ×2 IMPLANT
TUBING ARTHROSCOPY IRRIG 16FT (MISCELLANEOUS) ×3 IMPLANT
WAND STAR VAC 90 (SURGICAL WAND) ×3 IMPLANT
WATER STERILE IRR 1000ML POUR (IV SOLUTION) ×3 IMPLANT
YANKAUER SUCT BULB TIP NO VENT (SUCTIONS) IMPLANT

## 2013-10-31 NOTE — H&P (Signed)
Pamela Dalton is an 63 y.o. female.   Chief Complaint: Right shoulder pain HPI: Right shoulder chronic rotator cuff tear and unfavorable anatomy who has failed conservative management  Past Medical History  Diagnosis Date  . Hypertension   . Diabetes mellitus without complication   . Asthma   . Nerve pain   . Back pain   . Leg pain   . Arthritis   . Wears glasses   . Full dentures   . GERD (gastroesophageal reflux disease)   . DDD (degenerative disc disease)     Past Surgical History  Procedure Laterality Date  . Thumb surgery- joint removed  2009    Arthritis-rt  . Thyroid cyst excision  2009  . Abdominal hysterectomy  1981    one ovary/ tube removed  . Back surgery, 12/27/2012 l3 replaced cushion, l4/l5 fused  12/27/2012  . Back surgery  2005    L4, L5 fused  . Tonsillectomy    . Colonoscopy      Family History  Problem Relation Age of Onset  . Alzheimer's disease Father    Social History:  reports that she quit smoking about 10 years ago. She has never used smokeless tobacco. She reports that she does not drink alcohol or use illicit drugs.  Allergies:  Allergies  Allergen Reactions  . Codeine     REACTION: Rash and itching    Medications Prior to Admission  Medication Sig Dispense Refill  . amLODipine (NORVASC) 5 MG tablet Take 5 mg by mouth daily.       [name] aspirin 81 MG tablet Take 81 mg by mouth daily.      . benazepril (LOTENSIN) 10 MG tablet Take 10 mg by mouth daily.       . cyclobenzaprine (FLEXERIL) 10 MG tablet Take 10 mg by mouth 3 (three) times daily as needed for muscle spasms.       [name] esomeprazole (NEXIUM) 20 MG capsule Take 20 mg by mouth as needed.      . fluticasone (FLONASE) 50 MCG/ACT nasal spray Place 2 sprays into both nostrils daily.       . Fluticasone-Salmeterol (ADVAIR) 100-50 MCG/DOSE AEPB Inhale 1 puff into the lungs every 12 (twelve) hours.      . gabapentin (NEURONTIN) 300 MG capsule Take 600 mg by mouth 3 (three) times daily.        [name] losartan-hydrochlorothiazide (HYZAAR) 100-25 MG per tablet Take 1 tablet by mouth daily.      . meloxicam (MOBIC) 15 MG tablet Take 15 mg by mouth daily.       . metFORMIN (GLUCOPHAGE) 500 MG tablet Take 500 mg by mouth at bedtime.       . montelukast (SINGULAIR) 10 MG tablet Take 10 mg by mouth at bedtime.       [name] oxybutynin (DITROPAN) 5 MG tablet Take 1 tablet (5 mg total) by mouth 2 (two) times daily.  60 tablet  2  . traMADol (ULTRAM) 50 MG tablet Take 50 mg by mouth every 8 (eight) hours as needed for moderate pain. 1-2 as needed      . valACYclovir (VALTREX) 500 MG tablet Take 500 mg by mouth daily.      . hydrochlorothiazide (HYDRODIURIL) 25 MG tablet Take 25 mg by mouth daily.       . pantoprazole (PROTONIX) 40 MG tablet 40 mg daily.         Results for orders placed during the hospital encounter of 10/31/13 (from the  past 48 hour(s))  GLUCOSE, CAPILLARY     Status: Abnormal   Collection Time    10/31/13 12:05 PM      Result Value Ref Range   Glucose-Capillary 123 (*) 70 - 99 mg/dL  POCT HEMOGLOBIN-HEMACUE     Status: None   Collection Time    10/31/13 12:06 PM      Result Value Ref Range   Hemoglobin 13.5  12.0 - 15.0 g/dL   No results found.  Review of Systems  All other systems reviewed and are negative.   Blood pressure 88/59, pulse 90, temperature 97.9 F (36.6 C), temperature source Oral, resp. rate 18, height 5\' 2"  (1.575 m), weight 102.967 kg (227 lb), SpO2 97.00%. Physical Exam  Constitutional: She is oriented to person, place, and time. She appears well-developed and well-nourished.  HENT:  Head: Atraumatic.  Eyes: EOM are normal.  Cardiovascular: Intact distal pulses.   Respiratory: Effort normal.  Musculoskeletal:  Right shoulder tenderness to palpation laterally with pain with rotator cuff testing and tenderness over the a.c. joint.  Neurological: She is alert and oriented to person, place, and time.  Skin: Skin is warm and dry.  Psychiatric: She  has a normal mood and affect.     Assessment/Plan Right shoulder chronic rotator cuff tear with lateral anterior bone spurs in the a.c. joint arthropathy, failed conservative management. Plan arthroscopic debridement, rotator cuff tear, subacromial decompression, distal clavicle excision.Risks / benefits of surgery discussed Consent on chart  NPO for OR Preop antibiotics    Demond Shallenberger WILLIAM 10/31/2013, 12:58 PM

## 2013-10-31 NOTE — Anesthesia Preprocedure Evaluation (Addendum)
Anesthesia Evaluation  Patient identified by MRN, date of birth, ID band Patient awake    Reviewed: Allergy & Precautions, H&P , NPO status , Patient's Chart, lab work & pertinent test results  Airway Mallampati: I TM Distance: >3 FB Neck ROM: Full    Dental  (+) Edentulous Upper, Edentulous Lower, Dental Advisory Given   Pulmonary asthma , former smoker,  breath sounds clear to auscultation        Cardiovascular hypertension, Pt. on medications Rhythm:Regular     Neuro/Psych    GI/Hepatic GERD-  Medicated and Controlled,  Endo/Other  diabetes, Well Controlled, Oral Hypoglycemic AgentsMorbid obesity  Renal/GU      Musculoskeletal   Abdominal   Peds  Hematology   Anesthesia Other Findings   Reproductive/Obstetrics                         Anesthesia Physical Anesthesia Plan  ASA: III  Anesthesia Plan: General   Post-op Pain Management:    Induction: Intravenous  Airway Management Planned: Oral ETT  Additional Equipment:   Intra-op Plan:   Post-operative Plan: Extubation in OR  Informed Consent: I have reviewed the patients History and Physical, chart, labs and discussed the procedure including the risks, benefits and alternatives for the proposed anesthesia with the patient or authorized representative who has indicated his/her understanding and acceptance.   Dental advisory given  Plan Discussed with: CRNA, Anesthesiologist and Surgeon  Anesthesia Plan Comments:         Anesthesia Quick Evaluation

## 2013-10-31 NOTE — Anesthesia Procedure Notes (Addendum)
Procedure Name: Intubation Performed by: Tawni Millers Pre-anesthesia Checklist: Patient identified, Emergency Drugs available, Suction available and Patient being monitored Patient Re-evaluated:Patient Re-evaluated prior to inductionOxygen Delivery Method: Circle System Utilized Preoxygenation: Pre-oxygenation with 100% oxygen Intubation Type: IV induction Ventilation: Mask ventilation without difficulty Laryngoscope Size: Mac and 3 Grade View: Grade I Tube type: Oral Number of attempts: 1 Airway Equipment and Method: stylet and oral airway Placement Confirmation: ETT inserted through vocal cords under direct vision,  positive ETCO2 and breath sounds checked- equal and bilateral Tube secured with: Tape Dental Injury: Teeth and Oropharynx as per pre-operative assessment     Anesthesia Regional Block:  Interscalene brachial plexus block  Pre-Anesthetic Checklist: ,, timeout performed, Correct Patient, Correct Site, Correct Laterality, Correct Procedure, Correct Position, site marked, Risks and benefits discussed,  Surgical consent,  Pre-op evaluation,  At surgeon's request and post-op pain management  Laterality: Right and Upper  Prep: chloraprep       Needles:  Injection technique: Single-shot  Needle Type: Echogenic Needle     Needle Length: 5cm 5 cm Needle Gauge: 21 and 21 G    Additional Needles:  Procedures: ultrasound guided (picture in chart) Interscalene brachial plexus block Narrative:  Start time: 10/31/2013 12:40 PM End time: 10/31/2013 12:47 PM Injection made incrementally with aspirations every 5 mL.  Performed by: Personally  Anesthesiologist: Lorrene Reid, MD

## 2013-10-31 NOTE — Transfer of Care (Signed)
Immediate Anesthesia Transfer of Care Note  Patient: TASHINA CREDIT  Procedure(s) Performed: Procedure(s) with comments: Right shoulder arthroscopy rotator cuff debridement, subacromial decompression, distal clavical excision (Right) - Right shoulder arthroscopy rotator cuff debridement, subacromial decompression, distal clavical excision  Patient Location: PACU  Anesthesia Type:GA combined with regional for post-op pain  Level of Consciousness: awake  Airway & Oxygen Therapy: Patient Spontanous Breathing and Patient connected to face mask oxygen  Post-op Assessment: Report given to PACU RN and Post -op Vital signs reviewed and stable  Post vital signs: Reviewed and stable  Complications: No apparent anesthesia complications

## 2013-10-31 NOTE — Anesthesia Postprocedure Evaluation (Signed)
  Anesthesia Post-op Note  Patient: Pamela Dalton  Procedure(s) Performed: Procedure(s) with comments: Right shoulder arthroscopy rotator cuff debridement, subacromial decompression, distal clavical excision (Right) - Right shoulder arthroscopy rotator cuff debridement, subacromial decompression, distal clavical excision  Patient Location: PACU  Anesthesia Type: General   Level of Consciousness: awake, alert  and oriented  Airway and Oxygen Therapy: Patient Spontanous Breathing  Post-op Pain: mild  Post-op Assessment: Post-op Vital signs reviewed  Post-op Vital Signs: Reviewed  Last Vitals:  Filed Vitals:   10/31/13 1530  BP: 125/82  Pulse: 83  Temp:   Resp: 17    Complications: No apparent anesthesia complications

## 2013-10-31 NOTE — Progress Notes (Signed)
Assisted Dr. Crews with right, ultrasound guided, interscalene  block. Side rails up, monitors on throughout procedure. See vital signs in flow sheet. Tolerated Procedure well. 

## 2013-10-31 NOTE — Op Note (Signed)
Procedure(s): Right shoulder arthroscopy rotator cuff debridement, subacromial decompression, distal clavical excision Procedure Note  Pamela Dalton female 63 y.o. 10/31/2013  Procedure(s) and Anesthesia Type: #1 right shoulder arthroscopic extensive debridement of irreparable rotator cuff tear, biceps tenotomy and debridement of labrum #2 right shoulder arthroscopic subacromial decompression/removal bony loose fragments #3 right shoulder a scout distal clavicle excision     Surgeon(s) and Role:    * Nita Sells, MD - Primary     Surgeon: Nita Sells   Assistants: Jeanmarie Hubert PA-C (Danielle was present and scrubbed throughout the procedure and was essential in positioning, assisting with the camera and instrumentation,, and closure)  Anesthesia: General endotracheal anesthesia with preoperative interscalene block    Procedure Detail  Right shoulder arthroscopy rotator cuff debridement, subacromial decompression, distal clavical excision  Estimated Blood Loss: Min         Drains: none  Blood Given: none         Specimens: none        Complications:  * No complications entered in OR log *         Disposition: PACU - hemodynamically stable.         Condition: stable    Procedure:   INDICATIONS FOR SURGERY: The patient is 63 y.o. female who had a long history of right shoulder pain with a known chronic rotator cuff tear. She has failed conservative management was to go forward with surgery to try and decrease pain and increase function. She understood risks benefits alternatives procedure and wished to go forward.  OPERATIVE FINDINGS: Examination under anesthesia: No stiffness or instability Diagnostic Arthroscopy:  Glenoid articular cartilage: intact  Humeral head articular cartilage:  diffuse grade 4 changes with sloughing of the cartilage with multiple cartilaginous loose bodies debrided.  Labrum:  extensive tearing superior labrum  and long head biceps tendon addressed with tenotomy and debridement.  Loose bodies:  multiple chondral loose bodies debrided  Synovitis:  moderate  rotator cuff: Complete retracted tears of the supraspinatus and infraspinatus, debrided. She was noted to have fragmentation of the anterior and lateral acromion with 2 moderate-sized fragments of bone resected in the undersurface the acromion smoothed.   a.c. joint is severely arthritic and was addressed a standard distal clavicle excision.  DESCRIPTION OF PROCEDURE: The patient was identified in preoperative  holding area where I personally marked the operative site after  verifying site, side, and procedure with the patient. An interscalene block was given by the attending anesthesiologist the holding area.  The patient was taken back to the operating room where general anesthesia was induced without complication and was placed in the beach-chair position with the back  elevated about 60 degrees and all extremities and head and neck carefully padded and  positioned.   The right upper extremity was then prepped and  draped in a standard sterile fashion. The appropriate time-out  procedure was carried out. The patient did receive IV antibiotics  within 30 minutes of incision.   A small posterior portal incision was made and the arthroscope was introduced into the joint. An anterior portal was then established above the subscapularis using needle localization. Small cannula was placed anteriorly. Diagnostic arthroscopy was then carried out with findings as described above.   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 There were several peripheral chondral flaps on the humeral head which were debrided back to stable  edges. The glenoid was surprisingly grossly intact in terms of the cartilage. The biceps tendon was pulled into the joint and noted to be  severely frayed involving greater than 50% of the thickness of the tendon and therefore a large biter was used to perform a biceps tenotomy.. The superior labrum was debrided.   The arthroscope was then introduced into the subacromial space a standard lateral portal was established with needle localization.  She was noted to have significant fraying of the undersurface of the acromion and coracoacromial ligament. After debridement of the tuberosity and undersurface the acromion was noted she had 2 separate bony fragments both lateral and anterior on the acromion which were mobile. The ArthroCare was used to mobilize these fragments and then removed and through the lateral portal with a grasper. The undersurface the acromion was then smoothed with the 4 mm bur.   The distal clavicle was exposed arthroscopically and the bur was used to take off the undersurface for approximately 8 mm from the lateral portal. The bur was then moved to an anterior portal position to complete the distal clavicle excision resecting about 8 mm of the distal clavicle and a smooth even fashion. This was viewed from anterior and lateral portals and felt to be complete.  The arthroscopic equipment was removed from the joint and the portals were closed with 3-0 nylon in an interrupted fashion. Sterile dressings were then applied including Xeroform 4 x 4's ABDs and tape. The patient was then allowed to awaken from general anesthesia, placed in a sling, transferred to the stretcher and taken to the recovery room in stable condition.   POSTOPERATIVE PLAN: The patient will be discharged home today and will followup in one week for suture removal and wound check.

## 2013-10-31 NOTE — Discharge Instructions (Signed)
Discharge Instructions after Arthroscopic Shoulder Surgery ° ° °A sling has been provided for you. You may remove the sling after 72 hours. The sling may be worn for your protection, if you are in a crowd.  °Use ice on the shoulder intermittently over the first 48 hours after surgery.  °Pain medication has been prescribed for you.  °Use your medication liberally over the first 48 hours, and then begin to taper your use. You may take Extra Strength Tylenol or Tylenol only in place of the pain pills. DO NOT take ANY nonsteroidal anti-inflammatory pain medications: Advil, Motrin, Ibuprofen, Aleve, Naproxen, or Naprosyn.  °You may remove your dressing after two days.  °You may shower 5 days after surgery. The incision CANNOT get wet prior to 5 days. Simply allow the water to wash over the site and then pat dry. Do not rub the incision. Make sure your axilla (armpit) is completely dry after showering.  °Take one aspirin a day for 2 weeks after surgery, unless you have an aspirin sensitivity/allergy or asthma.  °Three to 5 times each day you should perform assisted overhead reaching and external rotation (outward turning) exercises with the operative arm. Both exercises should be done with the non-operative arm used as the "therapist arm" while the operative arm remains relaxed. Ten of each exercise should be done three to five times each day. ° ° ° °Overhead reach is helping to lift your stiff arm up as high as it will go. To stretch your overhead reach, lie flat on your back, relax, and grasp the wrist of the tight shoulder with your opposite hand. Using the power in your opposite arm, bring the stiff arm up as far as it is comfortable. Start holding it for ten seconds and then work up to where you can hold it for a count of 30. Breathe slowly and deeply while the arm is moved. Repeat this stretch ten times, trying to help the arm up a little higher each time.  ° ° ° ° ° °External rotation is turning the arm out to  the side while your elbow stays close to your body. External rotation is best stretched while you are lying on your back. Hold a cane, yardstick, broom handle, or dowel in both hands. Bend both elbows to a right angle. Use steady, gentle force from your normal arm to rotate the hand of the stiff shoulder out away from your body. Continue the rotation as far as it will go comfortably, holding it there for a count of 10. Repeat this exercise ten times.  ° ° ° °Please call [phone] during normal business hours or [phone] after hours for any problems. Including the following: ° °- excessive redness of the incisions °- drainage for more than 4 days °- fever of more than 101.5 F ° °*Please note that pain medications will not be refilled after hours or on weekends. ° ° ° °Post Anesthesia Home Care Instructions ° °Activity: °Get plenty of rest for the remainder of the day. A responsible adult should stay with you for 24 hours following the procedure.  °For the next 24 hours, DO NOT: °-Drive a car °-Operate machinery °-Drink alcoholic beverages °-Take any medication unless instructed by your physician °-Make any legal decisions or sign important papers. ° °Meals: °Start with liquid foods such as gelatin or soup. Progress to regular foods as tolerated. Avoid greasy, spicy, heavy foods. If nausea and/or vomiting occur, drink only clear liquids until the nausea and/or vomiting subsides. Call   your physician if vomiting continues. ° °Special Instructions/Symptoms: °Your throat may feel dry or sore from the anesthesia or the breathing tube placed in your throat during surgery. If this causes discomfort, gargle with warm salt water. The discomfort should disappear within 24 hours. ° ° °Regional Anesthesia Blocks ° °1. Numbness or the inability to move the "blocked" extremity may last from 3-48 hours after placement. The length of time depends on the medication injected and your individual response to the medication. If the  numbness is not going away after 48 hours, call your surgeon. ° °2. The extremity that is blocked will need to be protected until the numbness is gone and the  Strength has returned. Because you cannot feel it, you will need to take extra care to avoid injury. Because it may be weak, you may have difficulty moving it or using it. You may not know what position it is in without looking at it while the block is in effect. ° °3. For blocks in the legs and feet, returning to weight bearing and walking needs to be done carefully. You will need to wait until the numbness is entirely gone and the strength has returned. You should be able to move your leg and foot normally before you try and bear weight or walk. You will need someone to be with you when you first try to ensure you do not fall and possibly risk injury. ° °4. Bruising and tenderness at the needle site are common side effects and will resolve in a few days. ° °5. Persistent numbness or new problems with movement should be communicated to the surgeon or the [facility] ([phone])/ [facility] ([phone]). °

## 2013-11-01 ENCOUNTER — Encounter: Payer: Self-pay | Admitting: Obstetrics & Gynecology

## 2013-11-01 ENCOUNTER — Encounter (HOSPITAL_BASED_OUTPATIENT_CLINIC_OR_DEPARTMENT_OTHER): Payer: Self-pay | Admitting: Orthopedic Surgery

## 2013-11-13 ENCOUNTER — Encounter (HOSPITAL_COMMUNITY): Payer: Self-pay | Admitting: Emergency Medicine

## 2013-11-13 ENCOUNTER — Emergency Department (HOSPITAL_COMMUNITY): Payer: PRIVATE HEALTH INSURANCE

## 2013-11-13 ENCOUNTER — Emergency Department (HOSPITAL_COMMUNITY)
Admission: EM | Admit: 2013-11-13 | Discharge: 2013-11-13 | Disposition: A | Discharge status: Home / Self Care | Payer: PRIVATE HEALTH INSURANCE | Attending: Emergency Medicine | Admitting: Emergency Medicine

## 2013-11-13 DIAGNOSIS — R5383 Other fatigue: Secondary | ICD-10-CM | POA: Diagnosis not present

## 2013-11-13 DIAGNOSIS — Z791 Long term (current) use of non-steroidal anti-inflammatories (NSAID): Secondary | ICD-10-CM | POA: Insufficient documentation

## 2013-11-13 DIAGNOSIS — Z7982 Long term (current) use of aspirin: Secondary | ICD-10-CM | POA: Diagnosis not present

## 2013-11-13 DIAGNOSIS — Z98811 Dental restoration status: Secondary | ICD-10-CM | POA: Diagnosis not present

## 2013-11-13 DIAGNOSIS — R55 Syncope and collapse: Secondary | ICD-10-CM | POA: Insufficient documentation

## 2013-11-13 DIAGNOSIS — E119 Type 2 diabetes mellitus without complications: Secondary | ICD-10-CM | POA: Diagnosis not present

## 2013-11-13 DIAGNOSIS — M129 Arthropathy, unspecified: Secondary | ICD-10-CM | POA: Insufficient documentation

## 2013-11-13 DIAGNOSIS — Z79899 Other long term (current) drug therapy: Secondary | ICD-10-CM | POA: Diagnosis not present

## 2013-11-13 DIAGNOSIS — K219 Gastro-esophageal reflux disease without esophagitis: Secondary | ICD-10-CM | POA: Insufficient documentation

## 2013-11-13 DIAGNOSIS — J45909 Unspecified asthma, uncomplicated: Secondary | ICD-10-CM | POA: Insufficient documentation

## 2013-11-13 DIAGNOSIS — D649 Anemia, unspecified: Secondary | ICD-10-CM | POA: Diagnosis not present

## 2013-11-13 DIAGNOSIS — R5381 Other malaise: Secondary | ICD-10-CM | POA: Insufficient documentation

## 2013-11-13 DIAGNOSIS — IMO0002 Reserved for concepts with insufficient information to code with codable children: Secondary | ICD-10-CM | POA: Diagnosis not present

## 2013-11-13 DIAGNOSIS — I10 Essential (primary) hypertension: Secondary | ICD-10-CM | POA: Insufficient documentation

## 2013-11-13 DIAGNOSIS — Z87891 Personal history of nicotine dependence: Secondary | ICD-10-CM | POA: Insufficient documentation

## 2013-11-13 LAB — CBC
HCT: 35.9 % — ABNORMAL LOW (ref 36.0–46.0)
HEMOGLOBIN: 11.4 g/dL — AB (ref 12.0–15.0)
MCH: 27.1 pg (ref 26.0–34.0)
MCHC: 31.8 g/dL (ref 30.0–36.0)
MCV: 85.3 fL (ref 78.0–100.0)
Platelets: 278 10*3/uL (ref 150–400)
RBC: 4.21 MIL/uL (ref 3.87–5.11)
RDW: 16.5 % — ABNORMAL HIGH (ref 11.5–15.5)
WBC: 6.8 10*3/uL (ref 4.0–10.5)

## 2013-11-13 LAB — URINALYSIS, ROUTINE W REFLEX MICROSCOPIC
Bilirubin Urine: NEGATIVE
GLUCOSE, UA: NEGATIVE mg/dL
Hgb urine dipstick: NEGATIVE
Ketones, ur: NEGATIVE mg/dL
NITRITE: NEGATIVE
PH: 6.5 (ref 5.0–8.0)
Protein, ur: NEGATIVE mg/dL
Specific Gravity, Urine: 1.01 (ref 1.005–1.030)
Urobilinogen, UA: 1 mg/dL (ref 0.0–1.0)

## 2013-11-13 LAB — BASIC METABOLIC PANEL
Anion gap: 13 (ref 5–15)
BUN: 17 mg/dL (ref 6–23)
CO2: 24 meq/L (ref 19–32)
Calcium: 10.8 mg/dL — ABNORMAL HIGH (ref 8.4–10.5)
Chloride: 104 mEq/L (ref 96–112)
Creatinine, Ser: 0.82 mg/dL (ref 0.50–1.10)
GFR calc Af Amer: 87 mL/min — ABNORMAL LOW (ref >90)
GFR calc non Af Amer: 75 mL/min — ABNORMAL LOW (ref >90)
GLUCOSE: 113 mg/dL — AB (ref 70–99)
POTASSIUM: 4.1 meq/L (ref 3.7–5.3)
SODIUM: 141 meq/L (ref 137–147)

## 2013-11-13 LAB — POC OCCULT BLOOD, ED: FECAL OCCULT BLD: NEGATIVE

## 2013-11-13 LAB — URINE MICROSCOPIC-ADD ON

## 2013-11-13 LAB — CBG MONITORING, ED: GLUCOSE-CAPILLARY: 109 mg/dL — AB (ref 70–99)

## 2013-11-13 MED ORDER — SODIUM CHLORIDE 0.9 % IV BOLUS (SEPSIS)
1000.0000 mL | Freq: Once | INTRAVENOUS | Status: AC
  Administered 2013-11-13: 1000 mL via INTRAVENOUS

## 2013-11-13 NOTE — ED Provider Notes (Signed)
CSN: [id number]     Arrival date & time 11/13/13  1206 History   First MD Initiated Contact with Patient 11/13/13 1208     Chief Complaint  Patient presents with  . Fatigue     (Consider location/radiation/quality/duration/timing/severity/associated sxs/prior Treatment) HPI  Pamela Dalton is a(n) 63 y.o. female who presents to the ed with cc pf fatigue. The patient is one-week status post right shoulder arthroscopy. She states that she is caring for a 80-month-old at home and has had very little sleep. Patient states that this morning around 3 AM she awoke to get a bottle for the baby. She states that the she felt "not myself" and had difficulty ambulating. She states she had to hold onto things to keep her balance. The patient states that she got back to bed and got some sleep. She got up to go to church and states that she just never felt herself. She states that at church she sat down on one of the pins and her head down. She says she thinks she may have fallen asleep but she's not sure if she does ramble it happened. She states all she remembers is that she is surrounded by several people who were sitting around her. Apparently the ambulance was called and patient was transferred here to the emergency department. Patient was able to dress herself and get to church. She denies unilateral weakness, difficulty with speech or swallowing, vertigo. She states "I just feel wiped out." Denies fevers, chills, myalgias, arthralgias. Denies DOE, SOB, chest tightness or pressure, radiation to left arm, jaw or back, or diaphoresis. Denies dysuria, flank pain, suprapubic pain, frequency, urgency, or hematuria. Denies abdominal pain, nausea, vomiting, diarrhea or constipation.    Past Medical History  Diagnosis Date  . Hypertension   . Diabetes mellitus without complication   . Asthma   . Nerve pain   . Back pain   . Leg pain   . Arthritis   . Wears glasses   . Full dentures   . GERD  (gastroesophageal reflux disease)   . DDD (degenerative disc disease)    Past Surgical History  Procedure Laterality Date  . Thumb surgery- joint removed  2009    Arthritis-rt  . Thyroid cyst excision  2009  . Abdominal hysterectomy  1981    one ovary/ tube removed  . Back surgery, 12/27/2012 l3 replaced cushion, l4/l5 fused  12/27/2012  . Back surgery  2005    L4, L5 fused  . Tonsillectomy    . Colonoscopy    . Shoulder arthroscopy with rotator cuff repair and subacromial decompression Right 10/31/2013    Procedure: Right shoulder arthroscopy rotator cuff debridement, subacromial decompression, distal clavical excision;  Surgeon: Nita Sells, MD;  Location: [facility];  Service: Orthopedics;  Laterality: Right;  Right shoulder arthroscopy rotator cuff debridement, subacromial decompression, distal clavical excision   Family History  Problem Relation Age of Onset  . Alzheimer's disease Father    History  Substance Use Topics  . Smoking status: Former Smoker    Quit date: 10/27/2003  . Smokeless tobacco: Never Used     Comment: Quit smoking 2007  . Alcohol Use: No     Comment: stopped drinking 2009   OB History   Grav Para Term Preterm Abortions TAB SAB Ect Mult Living                 Review of Systems   Ten systems reviewed and are negative for  acute change, except as noted in the HPI.   Allergies  Codeine  Home Medications   Prior to Admission medications   Medication Sig Start Date End Date Taking? Authorizing Provider  amLODipine (NORVASC) 5 MG tablet Take 5 mg by mouth daily.  11/02/12  Yes Historical Provider, MD  aspirin 81 MG tablet Take 81 mg by mouth daily.   Yes Historical Provider, MD  benazepril (LOTENSIN) 10 MG tablet Take 10 mg by mouth daily.  11/02/12  Yes Historical Provider, MD  cyclobenzaprine (FLEXERIL) 10 MG tablet Take 10 mg by mouth 3 (three) times daily as needed for muscle spasms.  10/16/12  Yes Historical Provider, MD   esomeprazole (NEXIUM) 20 MG capsule Take 20 mg by mouth daily as needed (acid reflux).    Yes Historical Provider, MD  fluticasone (FLONASE) 50 MCG/ACT nasal spray Place 2 sprays into both nostrils 2 (two) times daily as needed for allergies.  11/12/12  Yes Historical Provider, MD  Fluticasone-Salmeterol (ADVAIR) 100-50 MCG/DOSE AEPB Inhale 1 puff into the lungs every 12 (twelve) hours.   Yes Historical Provider, MD  gabapentin (NEURONTIN) 300 MG capsule Take 600 mg by mouth 3 (three) times daily as needed (pain).   Yes Historical Provider, MD  hydrochlorothiazide (HYDRODIURIL) 25 MG tablet Take 25 mg by mouth daily.  11/02/12  Yes Historical Provider, MD  losartan-hydrochlorothiazide (HYZAAR) 100-25 MG per tablet Take 1 tablet by mouth daily.   Yes Historical Provider, MD  meloxicam (MOBIC) 15 MG tablet Take 15 mg by mouth daily.  10/16/12  Yes Historical Provider, MD  metFORMIN (GLUCOPHAGE) 500 MG tablet Take 500 mg by mouth at bedtime.  11/02/12  Yes Historical Provider, MD  montelukast (SINGULAIR) 10 MG tablet Take 10 mg by mouth at bedtime.  11/02/12  Yes Historical Provider, MD  Olopatadine HCl (PATADAY) 0.2 % SOLN Place 1 drop into both eyes daily.   Yes Historical Provider, MD  oxybutynin (DITROPAN) 5 MG tablet Take 1 tablet (5 mg total) by mouth 2 (two) times daily. 10/28/13  Yes Woodroe Mode, MD  oxyCODONE-acetaminophen (ROXICET) 5-325 MG per tablet Take 1-2 tablets by mouth every 4 (four) hours as needed for severe pain. 10/31/13  Yes Grier Mitts, PA-C  traMADol (ULTRAM) 50 MG tablet Take 50-100 mg by mouth every 8 (eight) hours as needed for moderate pain.  11/02/12  Yes Historical Provider, MD  valACYclovir (VALTREX) 500 MG tablet Take 500 mg by mouth daily.   Yes Historical Provider, MD   BP 126/83  Pulse 89  Temp(Src) 98.2 F (36.8 C) (Axillary)  Resp 16  SpO2 98% Physical Exam  Constitutional: She is oriented to person, place, and time. She appears well-developed and  well-nourished. No distress.  HENT:  Head: Normocephalic and atraumatic.  Eyes: Conjunctivae are normal. No scleral icterus.  Neck: Normal range of motion.  Cardiovascular: Normal rate, regular rhythm and normal heart sounds.  Exam reveals no gallop and no friction rub.   No murmur heard. Pulmonary/Chest: Effort normal and breath sounds normal. No respiratory distress.  Abdominal: Soft. Bowel sounds are normal. She exhibits no distension and no mass. There is no tenderness. There is no guarding.  Musculoskeletal: Normal range of motion.  Neurological: She is alert and oriented to person, place, and time.  Speech is clear and goal oriented, follows commands Major Cranial nerves without deficit, no facial droop Normal strength in upper and lower extremities bilaterally including dorsiflexion and plantar flexion, strong and equal grip strength Sensation normal to  light and sharp touch Moves extremities without ataxia, coordination intact Normal finger to nose and rapid alternating movements Neg romberg, no pronator drift Normal gait Normal heel-shin and balance   Skin: Skin is warm and dry. She is not diaphoretic.    ED Course  Procedures (including critical care time) Labs Review Labs Reviewed  CBC - Abnormal; Notable for the following:    Hemoglobin 11.4 (*)    HCT 35.9 (*)    RDW 16.5 (*)    All other components within normal limits  BASIC METABOLIC PANEL - Abnormal; Notable for the following:    Glucose, Bld 113 (*)    Calcium 10.8 (*)    GFR calc non Af Amer 75 (*)    GFR calc Af Amer 87 (*)    All other components within normal limits  CBG MONITORING, ED - Abnormal; Notable for the following:    Glucose-Capillary 109 (*)    All other components within normal limits  URINALYSIS, ROUTINE W REFLEX MICROSCOPIC  OCCULT BLOOD X 1 CARD TO LAB, STOOL    Imaging Review No results found.   EKG Interpretation   Date/Time:  Sunday November 13 2013 12:18:55 EDT Ventricular  Rate:  84 PR Interval:  155 QRS Duration: 82 QT Interval:  375 QTC Calculation: 443 R Axis:   28 Text Interpretation:  Sinus rhythm Consider left atrial enlargement  Abnormal R-wave progression, early transition LVH by voltage No  significant change since last tracing Confirmed by POLLINA  MD,  CHRISTOPHER ([id number]) on 11/13/2013 2:29:10 PM      MDM   Final diagnoses:  None    2:02 PM BP 126/83  Pulse 89  Temp(Src) 98.2 F (36.8 C) (Axillary)  Resp 16  SpO2 98% The patient was hemoglobin of 11.4. It was 13 one week ago. Wishes a 2 g drop.  Hemoglobin  Date Value Ref Range Status  11/13/2013 11.4* 12.0 - 15.0 g/dL Final  10/31/2013 13.5  12.0 - 15.0 g/dL Final  12/07/2009 13.4  12.0-15.0 g/dL Final  10/05/2008 13.7  12.0-15.0 g/dL Final   Orthostatic Lying - BP- Lying: 105/71 mmHg ; Pulse- Lying: 80  Orthostatic Sitting - BP- Sitting: 119/89 mmHg ; Pulse- Sitting: 90  Orthostatic Standing at 0 minutes - BP- Standing at 0 minutes: 118/83 mmHg ; Pulse- Standing at 0 minutes: 89   Static vital signs appear negative.  Chemistry panel is essentially unremarkable.   3:44 PM Patient had a negative CT scan. KG unchanged from previous.  Her Hemoccult is negative. Feel that patient's symptoms are likely related to her acute drop in hemoglobin. I also would have secondary to her previous surgery. Doubt TIA however patient's stroke risk is low with an ABCD2 score of 2 Will d/c patient to follow up with her pcp to recheck her hgb asap.  Margarita Mail, PA-C 11/15/13 1735

## 2013-11-13 NOTE — Discharge Instructions (Signed)
Your hemoglobin values are listed below. You will need to bring this paperwork to your Doctor so that they will have your values. Please rest and take care of yourself. Read the information below for return precautions.  Hemoglobin  Date Value Ref Range Status  11/13/2013 11.4* 12.0 - 15.0 g/dL Final  10/31/2013 13.5  12.0 - 15.0 g/dL Final  12/07/2009 13.4  12.0-15.0 g/dL Final  10/05/2008 13.7  12.0-15.0 g/dL Final    Anemia, Nonspecific Anemia is a condition in which the concentration of red blood cells or hemoglobin in the blood is below normal. Hemoglobin is a substance in red blood cells that carries oxygen to the tissues of the body. Anemia results in not enough oxygen reaching these tissues.  CAUSES  Common causes of anemia include:   Excessive bleeding. Bleeding may be internal or external. This includes excessive bleeding from periods (in women) or from the intestine.   Poor nutrition.   Chronic kidney, thyroid, and liver disease.  Bone marrow disorders that decrease red blood cell production.  Cancer and treatments for cancer.  HIV, AIDS, and their treatments.  Spleen problems that increase red blood cell destruction.  Blood disorders.  Excess destruction of red blood cells due to infection, medicines, and autoimmune disorders. SIGNS AND SYMPTOMS   Minor weakness.   Dizziness.   Headache.  Palpitations.   Shortness of breath, especially with exercise.   Paleness.  Cold sensitivity.  Indigestion.  Nausea.  Difficulty sleeping.  Difficulty concentrating. Symptoms may occur suddenly or they may develop slowly.  DIAGNOSIS  Additional blood tests are often needed. These help your health care provider determine the best treatment. Your health care provider will check your stool for blood and look for other causes of blood loss.  TREATMENT  Treatment varies depending on the cause of the anemia. Treatment can include:   Supplements of iron, vitamin  D78, or folic acid.   Hormone medicines.   A blood transfusion. This may be needed if blood loss is severe.   Hospitalization. This may be needed if there is significant continual blood loss.   Dietary changes.  Spleen removal. HOME CARE INSTRUCTIONS Keep all follow-up appointments. It often takes many weeks to correct anemia, and having your health care provider check on your condition and your response to treatment is very important. SEEK IMMEDIATE MEDICAL CARE IF:   You develop extreme weakness, shortness of breath, or chest pain.   You become dizzy or have trouble concentrating.  You develop heavy vaginal bleeding.   You develop a rash.   You have bloody or black, tarry stools.   You faint.   You vomit up blood.   You vomit repeatedly.   You have abdominal pain.  You have a fever or persistent symptoms for more than 2-3 days.   You have a fever and your symptoms suddenly get worse.   You are dehydrated.  MAKE SURE YOU:  Understand these instructions.  Will watch your condition.  Will get help right away if you are not doing well or get worse. Document Released: 05/01/2004 Document Revised: 11/24/2012 Document Reviewed: 09/17/2012 [facility] Patient Information 2015 [place], [place]. This information is not intended to replace advice given to you by your health care provider. Make sure you discuss any questions you have with your health care provider.  Transient Ischemic Attack A transient ischemic attack (TIA) is a "warning stroke" that causes stroke-like symptoms. Unlike a stroke, a TIA does not cause permanent damage to the brain.  The symptoms of a TIA can happen very fast and do not last long. It is important to know the symptoms of a TIA and what to do. This can help prevent a major stroke or death. CAUSES   A TIA is caused by a temporary blockage in an artery in the brain or neck (carotid artery). The blockage does not allow the brain to get the  blood supply it needs and can cause different symptoms. The blockage can be caused by either:  A blood clot.  Fatty buildup (plaque) in a neck or brain artery. RISK FACTORS  High blood pressure (hypertension).  High cholesterol.  Diabetes mellitus.  Heart disease.  The build up of plaque in the blood vessels (peripheral artery disease or atherosclerosis).  The build up of plaque in the blood vessels providing blood and oxygen to the brain (carotid artery stenosis).  An abnormal heart rhythm (atrial fibrillation).  Obesity.  Smoking.  Taking oral contraceptives (especially in combination with smoking).  Physical inactivity.  A diet high in fats, salt (sodium), and calories.  Alcohol use.  Use of illegal drugs (especially cocaine and methamphetamine).  Being female.  Being African American.  Being over the age of 66.  Family history of stroke.  Previous history of blood clots, stroke, TIA, or heart attack.  Sickle cell disease. SYMPTOMS  TIA symptoms are the same as a stroke but are temporary. These symptoms usually develop suddenly, or may be newly present upon awakening from sleep:  Sudden weakness or numbness of the face, arm, or leg, especially on one side of the body.  Sudden trouble walking or difficulty moving arms or legs.  Sudden confusion.  Sudden personality changes.  Trouble speaking (aphasia) or understanding.  Difficulty swallowing.  Sudden trouble seeing in one or both eyes.  Double vision.  Dizziness.  Loss of balance or coordination.  Sudden severe headache with no known cause.  Trouble reading or writing.  Loss of bowel or bladder control.  Loss of consciousness. DIAGNOSIS  Your caregiver may be able to determine the presence or absence of a TIA based on your symptoms, history, and physical exam. Computed tomography (CT scan) of the brain is usually performed to help identify a TIA. Other tests may be done to diagnose a TIA.  These tests may include:  Electrocardiography.  Continuous heart monitoring.  Echocardiography.  Carotid ultrasonography.  Magnetic resonance imaging (MRI).  A scan of the brain circulation.  Blood tests. PREVENTION  The risk of a TIA can be decreased by appropriately treating high blood pressure, high cholesterol, diabetes, heart disease, and obesity and by quitting smoking, limiting alcohol, and staying physically active. TREATMENT  Time is of the essence. Since the symptoms of TIA are the same as a stroke, it is important to seek treatment as soon as possible because you may need a medicine to dissolve the clot (thrombolytic) that cannot be given if too much time has passed. Treatment options vary. Treatment options may include rest, oxygen, intravenous (IV) fluids, and medicines to thin the blood (anticoagulants). Medicines and diet may be used to address diabetes, high blood pressure, and other risk factors. Measures will be taken to prevent short-term and long-term complications, including infection from breathing foreign material into the lungs (aspiration pneumonia), blood clots in the legs, and falls. Treatment options include procedures to either remove plaque in the carotid arteries or dilate carotid arteries that have narrowed due to plaque. Those procedures are:  Carotid endarterectomy.  Carotid angioplasty and  stenting. HOME CARE INSTRUCTIONS   Take all medicines prescribed by your caregiver. Follow the directions carefully. Medicines may be used to control risk factors for a stroke. Be sure you understand all your medicine instructions.  You may be told to take aspirin or the anticoagulant warfarin. Warfarin needs to be taken exactly as instructed.  Taking too much or too little warfarin is dangerous. Too much warfarin increases the risk of bleeding. Too little warfarin continues to allow the risk for blood clots. While taking warfarin, you will need to have regular blood  tests to measure your blood clotting time. A PT blood test measures how long it takes for blood to clot. Your PT is used to calculate another value called an INR. Your PT and INR help your caregiver to adjust your dose of warfarin. The dose can change for many reasons. It is critically important that you take warfarin exactly as prescribed.  Many foods, especially foods high in vitamin K can interfere with warfarin and affect the PT and INR. Foods high in vitamin K include spinach, kale, broccoli, cabbage, collard and turnip greens, brussels sprouts, peas, cauliflower, seaweed, and parsley as well as beef and pork liver, green tea, and soybean oil. You should eat a consistent amount of foods high in vitamin K. Avoid major changes in your diet, or notify your caregiver before changing your diet. Arrange a visit with a dietitian to answer your questions.  Many medicines can interfere with warfarin and affect the PT and INR. You must tell your caregiver about any and all medicines you take, this includes all vitamins and supplements. Be especially cautious with aspirin and anti-inflammatory medicines. Do not take or discontinue any prescribed or over-the-counter medicine except on the advice of your caregiver or pharmacist.  Warfarin can have side effects, such as excessive bruising or bleeding. You will need to hold pressure over cuts for longer than usual. Your caregiver or pharmacist will discuss other potential side effects.  Avoid sports or activities that may cause injury or bleeding.  Be mindful when shaving, flossing your teeth, or handling sharp objects.  Alcohol can change the body's ability to handle warfarin. It is best to avoid alcoholic drinks or consume only very small amounts while taking warfarin. Notify your caregiver if you change your alcohol intake.  Notify your dentist or other caregivers before procedures.  Eat a diet that includes 5 or more servings of fruits and vegetables each  day. This may reduce the risk of stroke. Certain diets may be prescribed to address high blood pressure, high cholesterol, diabetes, or obesity.  A low-sodium, low-saturated fat, low-trans fat, low-cholesterol diet is recommended to manage high blood pressure.  A low-saturated fat, low-trans fat, low-cholesterol, and high-fiber diet may control cholesterol levels.  A controlled-carbohydrate, controlled-sugar diet is recommended to manage diabetes.  A reduced-calorie, low-sodium, low-saturated fat, low-trans fat, low-cholesterol diet is recommended to manage obesity.  Maintain a healthy weight.  Stay physically active. It is recommended that you get at least 30 minutes of activity on most or all days.  Do not smoke.  Limit alcohol use even if you are not taking warfarin. Moderate alcohol use is considered to be:  No more than 2 drinks each day for men.  No more than 1 drink each day for nonpregnant women.  Stop drug abuse.  Home safety. A safe home environment is important to reduce the risk of falls. Your caregiver may arrange for specialists to evaluate your home. Having grab bars  in the bedroom and bathroom is often important. Your caregiver may arrange for equipment to be used at home, such as raised toilets and a seat for the shower.  Follow all instructions for follow-up with your caregiver. This is very important. This includes any referrals and lab tests. Proper follow up can prevent a stroke or another TIA from occurring. SEEK MEDICAL CARE IF:  You have personality changes.  You have difficulty swallowing.  You are seeing double.  You have dizziness.  You have a fever.  You have skin breakdown. SEEK IMMEDIATE MEDICAL CARE IF:  Any of these symptoms may represent a serious problem that is an emergency. Do not wait to see if the symptoms will go away. Get medical help right away. Call your local emergency services (911 in [place] Do not drive yourself to the  hospital.  You have sudden weakness or numbness of the face, arm, or leg, especially on one side of the body.  You have sudden trouble walking or difficulty moving arms or legs.  You have sudden confusion.  You have trouble speaking (aphasia) or understanding.  You have sudden trouble seeing in one or both eyes.  You have a loss of balance or coordination.  You have a sudden, severe headache with no known cause.  You have new chest pain or an irregular heartbeat.  You have a partial or total loss of consciousness. MAKE SURE YOU:   Understand these instructions.  Will watch your condition.  Will get help right away if you are not doing well or get worse. Document Released: 01/01/2005 Document Revised: 03/29/2013 Document Reviewed: 06/29/2013 [facility] Patient Information 2015 [place], [place]. This information is not intended to replace advice given to you by your health care provider. Make sure you discuss any questions you have with your health care provider.

## 2013-11-13 NOTE — ED Notes (Addendum)
EMS reports patient has not been sleeping well, was at church today and had an episode where she felt like she passed out. Did not fall or have syncopal event. Pt reports was talking to someone then just "couldnt hear anymore" and "blacked out."  Pt reports increased stress and anxiety over last several weeks. Has not been sleeping well for > 6 months.

## 2013-11-16 ENCOUNTER — Ambulatory Visit (HOSPITAL_COMMUNITY)
Admission: RE | Admit: 2013-11-16 | Discharge: 2013-11-16 | Disposition: A | Discharge status: Home / Self Care | Payer: PRIVATE HEALTH INSURANCE | Source: Ambulatory Visit | Attending: Family Medicine | Admitting: Family Medicine

## 2013-11-16 DIAGNOSIS — Z1231 Encounter for screening mammogram for malignant neoplasm of breast: Secondary | ICD-10-CM | POA: Diagnosis not present

## 2013-11-17 NOTE — ED Provider Notes (Signed)
Medical screening examination/treatment/procedure(s) were performed by non-physician practitioner and as supervising physician I was immediately available for consultation/collaboration.   EKG Interpretation   Date/Time:  Sunday November 13 2013 12:18:55 EDT Ventricular Rate:  84 PR Interval:  155 QRS Duration: 82 QT Interval:  375 QTC Calculation: 443 R Axis:   28 Text Interpretation:  Sinus rhythm Consider left atrial enlargement  Abnormal R-wave progression, early transition LVH by voltage No  significant change since last tracing Confirmed by Betsey Holiday  MD,  CHRISTOPHER [phone]) on 11/13/2013 2:29:10 PM       Orpah Greek, MD 11/17/13 1740

## 2013-12-09 ENCOUNTER — Encounter: Payer: Self-pay | Admitting: Obstetrics & Gynecology

## 2013-12-09 ENCOUNTER — Ambulatory Visit (INDEPENDENT_AMBULATORY_CARE_PROVIDER_SITE_OTHER): Payer: PRIVATE HEALTH INSURANCE | Admitting: Obstetrics & Gynecology

## 2013-12-09 ENCOUNTER — Other Ambulatory Visit: Payer: Self-pay | Admitting: Obstetrics & Gynecology

## 2013-12-09 VITALS — BP 108/77 | HR 87 | Ht 62.0 in | Wt 219.3 lb

## 2013-12-09 DIAGNOSIS — N318 Other neuromuscular dysfunction of bladder: Secondary | ICD-10-CM

## 2013-12-09 DIAGNOSIS — N3281 Overactive bladder: Secondary | ICD-10-CM

## 2013-12-09 MED ORDER — OXYBUTYNIN CHLORIDE 5 MG PO TABS: 5.0000 mg | ORAL_TABLET | Freq: Two times a day (BID) | ORAL | Status: DC

## 2013-12-09 MED ORDER — OXYBUTYNIN CHLORIDE 5 MG PO TABS: 5.0000 mg | ORAL_TABLET | Freq: Three times a day (TID) | ORAL | Status: DC

## 2013-12-09 NOTE — Patient Instructions (Signed)
Overactive Bladder The bladder has two functions that are totally opposite of the other. One is to relax and stretch out so it can store urine (fills like a balloon), and the other is to contract and squeeze down so that it can empty the urine that it has stored. Proper functioning of the bladder is a complex mixing of these two functions. The filling and emptying of the bladder can be influenced by:  The bladder.  The spinal cord.  The brain.  The nerves going to the bladder.  Other organs that are closely related to the bladder such as prostate in males and the vagina in females. As your bladder fills with urine, nerve signals are sent from the bladder to the brain to tell you that you may need to urinate. Normal urination requires that the bladder squeeze down with sufficient strength to empty the bladder, but this also requires that the bladder squeeze down sufficiently long to finish the job. In addition the sphincter muscles, which normally keep you from leaking urine, must also relax so that the urine can pass. Coordination between the bladder muscle squeezing down and the sphincter muscles relaxing is required to make everything happen normally. With an overactive bladder sometimes the muscles of the bladder contract unexpectedly and involuntarily and this causes an urgent need to urinate. The normal response is to try to hold urine in by contracting the sphincter muscles. Sometimes the bladder contracts so strongly that the sphincter muscles cannot stop the urine from passing out and incontinence occurs. This kind of incontinence is called urge incontinence. Having an overactive bladder can be embarrassing and awkward. It can keep you from living life the way you want to. Many people think it is just something you have to put up with as you grow older or have certain health conditions. In fact, there are treatments that can help make your life easier and more pleasant. CAUSES  Many things  can cause an overactive bladder. Possibilities include:  Urinary tract infection or infection of nearby tissues such as the prostate.  Prostate enlargement.  In women, multiple pregnancies or surgery on the uterus or urethra.  Bladder stones, inflammation, or tumors.  Caffeine.  Alcohol.  Medications. For example, diuretics (drugs that help the body get rid of extra fluid) increase urine production. Some other medicines must be taken with lots of fluids.  Muscle or nerve weakness. This might be the result of a spinal cord injury, a stroke, multiple sclerosis, or Parkinson disease.  Diabetes can cause a high urine volume which fills the bladder so quickly that the normal urge to urinate is triggered very strongly. SYMPTOMS   Loss of bladder control. You feel the need to urinate and cannot make your body wait.  Sudden, strong urges to urinate.  Urinating 8 or more times a day.  Waking up to urinate two or more times a night. DIAGNOSIS  To decide if you have overactive bladder, your health care provider will probably:  Ask about symptoms you have noticed.  Ask about your overall health. This will include questions about any medications you are taking.  Do a physical examination. This will help determine if there are obvious blockages or other problems.  Order some tests. These might include:  A blood test to check for diabetes or other health issues that could be contributing to the problem.  Urine testing. This could measure the flow of urine and the pressure on the bladder.  A test of your neurological   system (the brain, spinal cord, and nerves). This is the system that senses the need to urinate. Some of these tests are called flow tests, bladder pressure tests, and electrical measurements of the sphincter muscle.  A bladder test to check whether it is emptying completely when you urinate.  Cystoscopy. This test uses a thin tube with a tiny camera on it. It offers a  look inside your urethra and bladder to see if there are problems.  Imaging tests. You might be given a contrast dye and then asked to urinate. X-rays are taken to see how your bladder is working. TREATMENT  An overactive bladder can be treated in many ways. The treatment will depend on the cause. Whether you have a mild or severe case also makes a difference. Often, treatment can be given in your health care provider's office or clinic. Be sure to discuss the different options with your caregiver. They include:  Behavioral treatments. These do not involve medication or surgery:  Bladder training. For this, you would follow a schedule to urinate at regular intervals. This helps you learn to control the urge to urinate. At first, you might be asked to wait a few minutes after feeling the urge. In time, you should be able to schedule bathroom visits an hour or more apart.  Kegel exercises. These exercises strengthen the pelvic floor muscles, which support the bladder. Toning these muscles can help control urination even if the bladder muscles are overactive. A specialist will teach you how to do these exercises correctly. They will require daily practice.  Weight loss. If you are obese or overweight, losing weight might stop your bladder from being overactive. Talk to your health care provider about how many pounds you should lose. Also ask if there is a specific program or method that would work best for you.  Diet change. This might be suggested if constipation is making your overactive bladder worse. Your health care provider or a nutritionist can explain ways to change what you eat to ease constipation. Other people might need to take in less caffeine or alcohol. Sometimes drinking fewer fluids is needed, too.  Protection. This is not an actual treatment. But, you could wear special pads to take care of any leakage while you wait for other treatments to take effect. This will help you avoid  embarrassment.  Physical treatments.  Electrical stimulation. Electrodes will send gentle pulses to the nerves or muscles that help control the bladder. The goal is to strengthen them. Sometimes this is done with the electrodes outside the body. Or, they might be placed inside the body (implanted). This treatment can take several months to have an effect.  Medications. These are usually used along with other treatments. Several medicines are available. Some are injected into the muscles involved in urination. Others come in pill form. Medications sometimes prescribed include:  Anticholinergics. These drugs block the signals that the nerves deliver to the bladder. This keeps it from releasing urine at the wrong time. Researchers think the drugs might help in other ways, too.  Imipramine. This is an antidepressant. But, it relaxes bladder muscles.  Botox. This is still experimental. Some people believe that injecting it into the bladder muscles will relax them so they work more normally. It has also been injected into the sphincter muscle when the sphincter muscle does not open properly. This is a temporary fix, however. Also, it might make matters worse, especially in older people.  Surgery.  A device might be implanted   to help manage your nerves. It works on the nerves that signal when you need to urinate.  Surgery is sometimes needed with electrical stimulation. If the electrodes are implanted, this is done through surgery.  Sometimes repairs need to be made through surgery. For example, the size of the bladder can be changed. This is usually done in severe cases only. HOME CARE INSTRUCTIONS   Take any medications your health care provider prescribed or suggested. Follow the directions carefully.  Practice any lifestyle changes that are recommended. These might include:  Drinking less fluid or drinking at different times of the day. If you need to urinate often during the night, for  example, you may need to stop drinking fluids early in the evening.  Cutting down on caffeine or alcohol. They can both make an overactive bladder worse. Caffeine is found in coffee, tea, and sodas.  Doing Kegel exercises to strengthen muscles.  Losing weight, if that is recommended.  Eating a healthy and balanced diet. This will help you avoid constipation.  Keep a journal or a log. You might be asked to record how much you drink and when, and also when you feel the need to urinate.  Learn how to care for implants or other devices, such as pessaries. SEEK MEDICAL CARE IF:   Your overactive bladder gets worse.  You feel increased pain or irritation when you urinate.  You notice blood in your urine.  You have questions about any medications or devices that your health care provider recommended.  You notice blood, pus, or swelling at the site of any test or treatment procedure.  You have an oral temperature above 102F (38.9C). SEEK IMMEDIATE MEDICAL CARE IF:  You have an oral temperature above 102F (38.9C), not controlled by medicine. Document Released: 01/18/2009 Document Revised: 08/08/2013 Document Reviewed: 01/18/2009 Exit[employer] Patient Information 2015 [employer]. This information is not intended to replace advice given to you by your health care provider. Make sure you discuss any questions you have with your health care provider.  

## 2013-12-09 NOTE — Progress Notes (Deleted)
Scheduled diagnostic mammogram on

## 2013-12-09 NOTE — Progress Notes (Signed)
Subjective:     Patient ID: Pamela Dalton, female   DOB: 10/13/1950, 63 y.o.   MRN: [id number]  HPI Pt reports that her sx had completely resolved onth etid Ditropan then she had problems getting her meds from the pharmacy in the tid and her sx returned.  She would like to stay on the same meds in the TID dosing if possible.   Review of Systems     Objective:   Physical Exam BP 108/77  Pulse 87  Ht 5\' 2"  (1.575 m)  Wt 219 lb 4.8 oz (99.474 kg)  BMI 40.10 kg/m2 Pt in NAD Exam deferred     Assessment:     OAB     Plan:     Continue Ditropan 5mg  tid  F/u 6 months

## 2013-12-16 ENCOUNTER — Telehealth: Payer: Self-pay | Admitting: General Practice

## 2013-12-16 NOTE — Telephone Encounter (Addendum)
Patient called and left message stating she was here on Tuesday and wants to know if her medications have been refilled to her [facility] [address]. Per chart review, medications were sent to pharmacy. Called patient, no answer- left message stating we are returning your phone call and the medication has been refilled and sent to your pharmacy and if you have any questions or concerns you can call us back at the clinics.   9/14  0755  Pt left new message on 9/11 @ 1209 stating that she was at the pharmacy to pick up her medication and they did not have her refill for the bedtime oxybutynin.  I reviewed her chart and determined that Dr. Ihor Dow incorporated the bedtime dose into the Rx on 9/4 by increasing the dosage to 3 times daily from 2 times daily previously.

## 2013-12-29 ENCOUNTER — Telehealth: Payer: Self-pay

## 2013-12-29 NOTE — Telephone Encounter (Signed)
Patient called stating she went to pick up her RX and she was supposed to have oxybutin extended release for night time prescribed but the pharmacy did not have it. Per chart review patient is to take Ditropan 5mg  TID. Called patient and informed her of this. Patient stated she didn't know she was supposed to take it TID-- she had been taking it BID. Patient verbalized understanding and stated she will start taking it at night. No further questions or concerns.

## 2014-01-24 ENCOUNTER — Telehealth: Payer: Self-pay | Admitting: *Deleted

## 2014-01-24 DIAGNOSIS — N3281 Overactive bladder: Secondary | ICD-10-CM

## 2014-01-24 NOTE — Telephone Encounter (Signed)
Patient called and left message stating that her incontinence is getting worse. She is taking her Ditropan as directed but is now having trouble making it to the bathroom in time. Patient wanted to see what the next step would be in helping her with this problem.

## 2014-01-24 NOTE — Telephone Encounter (Signed)
Will send note to Dr. Ihor Dow for her input.

## 2014-01-25 MED ORDER — OXYBUTYNIN CHLORIDE 5 MG PO TABS: 5.0000 mg | ORAL_TABLET | Freq: Three times a day (TID) | ORAL | Status: AC

## 2014-01-25 NOTE — Telephone Encounter (Signed)
Patient called again stated that she needs refills sent on her ditropan. [facility] told her she has no refills. Per Dr. Eugenie Norrie note she should have been given 3 refills. Rx sent to pharmacy for patient. Patient advised that we will await Dr. Eugenie Norrie advice for what else to do next.

## 2014-01-31 ENCOUNTER — Telehealth: Payer: Self-pay | Admitting: Obstetrics & Gynecology

## 2014-01-31 NOTE — Telephone Encounter (Signed)
Pt reports that her meds are not working as well this week. She reports that she has been having dark smelly urine and a 'sensation' in her lower pelvis  But, not quite pain.  D/w pt that this could be a UTI and if it is, we can treat it and continue with her current meds.  HOWEVER, if her urine is clear, I rec a referral to urology.  Pt will f/u tomorrow for UA only. With urine cx if needed.  Achille Xiang L. Harraway-Smith, M.D., Cherlynn June

## 2014-01-31 NOTE — Telephone Encounter (Signed)
Pamela Drafts, MD at 01/31/2014 11:55 AM     Status: Signed        Pt reports that her meds are not working as well this week. She reports that she has been having dark smelly urine and a 'sensation' in her lower pelvis But, not quite pain.  D/w pt that this could be a UTI and if it is, we can treat it and continue with her current meds. HOWEVER, if her urine is clear, I rec a referral to urology. Pt will f/u tomorrow for UA only. With urine cx if needed.  Carolyn L. Harraway-Smith, M.D., Cherlynn June

## 2014-02-02 ENCOUNTER — Ambulatory Visit: Payer: PRIVATE HEALTH INSURANCE | Admitting: *Deleted

## 2014-02-02 ENCOUNTER — Telehealth: Payer: Self-pay | Admitting: *Deleted

## 2014-02-02 DIAGNOSIS — R32 Unspecified urinary incontinence: Secondary | ICD-10-CM

## 2014-02-02 LAB — POCT URINALYSIS DIP (DEVICE)
GLUCOSE, UA: NEGATIVE mg/dL
HGB URINE DIPSTICK: NEGATIVE
Leukocytes, UA: NEGATIVE
NITRITE: NEGATIVE
PROTEIN: NEGATIVE mg/dL
Specific Gravity, Urine: >=1.03 (ref 1.005–1.030)
UROBILINOGEN UA: 1 mg/dL (ref 0.0–1.0)
pH: 5 (ref 5.0–8.0)

## 2014-02-02 NOTE — Telephone Encounter (Signed)
Referral made to [facility] for first available appointment 04/05/14 1:45 with Dr. McDiarmid.  Called and notified Pamela Dalton her urine did not show she has a uti and referral made. Gave her appointment date/time.

## 2014-02-02 NOTE — Telephone Encounter (Signed)
Patient called wondering if she could come in for lab work today. Called patient and informed her she may come any time today during office hours to give urine specimen. Patient stated she will be here this morning.

## 2014-02-02 NOTE — Telephone Encounter (Addendum)
Message copied by Samuel Germany on Thu Feb 02, 2014  1:24 PM ------      Message from: Geralyn Flash L      Created: Thu Feb 02, 2014  1:09 PM       Needs urology referral to [facility] [phone] because urine today negative for UTI.  Call patient with results and appointment.  ------

## 2014-02-02 NOTE — Progress Notes (Signed)
Patient left urine specimen and left before talking to a nurse. Urinalysis ran - negative for nitrites , protein, or leukocytes. Will refer to urology

## 2014-03-20 ENCOUNTER — Encounter: Payer: Self-pay | Admitting: *Deleted

## 2014-04-20 DIAGNOSIS — E139 Other specified diabetes mellitus without complications: Secondary | ICD-10-CM | POA: Diagnosis not present

## 2014-05-04 DIAGNOSIS — N3946 Mixed incontinence: Secondary | ICD-10-CM | POA: Diagnosis not present

## 2014-05-04 DIAGNOSIS — R351 Nocturia: Secondary | ICD-10-CM | POA: Diagnosis not present

## 2014-05-11 ENCOUNTER — Other Ambulatory Visit: Payer: Self-pay | Admitting: Orthopaedic Surgery

## 2014-05-11 DIAGNOSIS — M5106 Intervertebral disc disorders with myelopathy, lumbar region: Secondary | ICD-10-CM | POA: Diagnosis not present

## 2014-05-11 DIAGNOSIS — M5136 Other intervertebral disc degeneration, lumbar region: Secondary | ICD-10-CM | POA: Diagnosis not present

## 2014-05-22 ENCOUNTER — Other Ambulatory Visit: Payer: Medicaid Other

## 2014-05-30 DIAGNOSIS — M545 Low back pain: Secondary | ICD-10-CM | POA: Diagnosis not present

## 2014-06-09 DIAGNOSIS — G894 Chronic pain syndrome: Secondary | ICD-10-CM | POA: Diagnosis not present

## 2014-06-09 DIAGNOSIS — M5136 Other intervertebral disc degeneration, lumbar region: Secondary | ICD-10-CM | POA: Diagnosis not present

## 2014-06-12 DIAGNOSIS — M1712 Unilateral primary osteoarthritis, left knee: Secondary | ICD-10-CM | POA: Diagnosis not present

## 2014-06-12 DIAGNOSIS — M25562 Pain in left knee: Secondary | ICD-10-CM | POA: Diagnosis not present

## 2014-06-16 DIAGNOSIS — J019 Acute sinusitis, unspecified: Secondary | ICD-10-CM | POA: Diagnosis not present

## 2014-06-16 DIAGNOSIS — J309 Allergic rhinitis, unspecified: Secondary | ICD-10-CM | POA: Diagnosis not present

## 2014-07-19 DIAGNOSIS — E139 Other specified diabetes mellitus without complications: Secondary | ICD-10-CM | POA: Diagnosis not present

## 2014-07-19 DIAGNOSIS — A6000 Herpesviral infection of urogenital system, unspecified: Secondary | ICD-10-CM | POA: Diagnosis not present

## 2014-08-22 DIAGNOSIS — M25562 Pain in left knee: Secondary | ICD-10-CM | POA: Diagnosis not present

## 2014-08-22 DIAGNOSIS — M1712 Unilateral primary osteoarthritis, left knee: Secondary | ICD-10-CM | POA: Diagnosis not present

## 2014-08-29 ENCOUNTER — Ambulatory Visit: Payer: Medicare Other | Attending: Family Medicine | Admitting: Physical Therapy

## 2014-08-29 DIAGNOSIS — M51369 Other intervertebral disc degeneration, lumbar region without mention of lumbar back pain or lower extremity pain: Secondary | ICD-10-CM

## 2014-08-29 DIAGNOSIS — M25562 Pain in left knee: Secondary | ICD-10-CM | POA: Diagnosis not present

## 2014-08-29 DIAGNOSIS — M25462 Effusion, left knee: Secondary | ICD-10-CM | POA: Diagnosis not present

## 2014-08-29 DIAGNOSIS — M5136 Other intervertebral disc degeneration, lumbar region: Secondary | ICD-10-CM | POA: Diagnosis not present

## 2014-08-29 DIAGNOSIS — R262 Difficulty in walking, not elsewhere classified: Secondary | ICD-10-CM

## 2014-08-29 DIAGNOSIS — G8929 Other chronic pain: Secondary | ICD-10-CM

## 2014-08-29 NOTE — Therapy (Signed)
[place], [name], [address] Phone: [phone]   Fax:  [phone]  Physical Therapy Evaluation  Patient Details  Name: Pamela Dalton MRN: [id number] Date of Birth: [birth year] Referring Provider:  Gentry Fitz, MD  Encounter Date: 08/29/2014      PT End of Session - 08/29/14 1041    Visit Number 1   Number of Visits 16   Date for PT Re-Evaluation 10/24/14   PT Start Time 0950   PT Stop Time 1048   PT Time Calculation (min) 58 min   Activity Tolerance Patient limited by pain      Past Medical History  Diagnosis Date  . Hypertension   . Diabetes mellitus without complication   . Asthma   . Nerve pain   . Back pain   . Leg pain   . Arthritis   . Wears glasses   . Full dentures   . GERD (gastroesophageal reflux disease)   . DDD (degenerative disc disease)     Past Surgical History  Procedure Laterality Date  . Thumb surgery- joint removed  2009    Arthritis-rt  . Thyroid cyst excision  2009  . Abdominal hysterectomy  1981    one ovary/ tube removed  . Back surgery, 12/27/2012 l3 replaced cushion, l4/l5 fused  12/27/2012  . Back surgery  2005    L4, L5 fused  . Tonsillectomy    . Colonoscopy    . Shoulder arthroscopy with rotator cuff repair and subacromial decompression Right 10/31/2013    Procedure: Right shoulder arthroscopy rotator cuff debridement, subacromial decompression, distal clavical excision;  Surgeon: Nita Sells, MD;  Location: [facility];  Service: Orthopedics;  Laterality: Right;  Right shoulder arthroscopy rotator cuff debridement, subacromial decompression, distal clavical excision    There were no vitals filed for this visit.  Visit Diagnosis:  Knee pain, chronic, left - Plan: PT plan of care cert/re-cert  Difficulty walking - Plan: PT plan of care cert/re-cert  Knee swelling, left - Plan: PT plan of care  cert/re-cert  Degenerative disc disease, lumbar - Plan: PT plan of care cert/re-cert      Subjective Assessment - 08/29/14 0953    Subjective Patient has L knee OA.  She fell in 06/16/13 which she reports as the start of her pain.  She has had 2 supart injections which helped a bit. She has pain, stiffness, swelling, difficulty walking and sit to stand transfers.     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 has to elevate leg, but only a few minutes   How long can you stand comfortably? stands 10 min (stands with brace in church, 3 hours or more, is an usher)   How long can you walk comfortably? walks 15 min to 30 min to daughters house   Diagnostic tests 2015   Patient Stated Goals Pt wants to be able to reduce pain and be more active, return to gym    Currently in Pain? Yes   Pain Score 6   never less than 5/10   Pain Location Knee   Pain Orientation Left   Pain Descriptors / Indicators Aching;Stabbing   Pain Type Chronic pain   Pain Radiating Towards lateral leg, foot   Pain Onset More than a month ago   Pain Frequency Constant   Aggravating  Factors  walking, activity   Pain Relieving Factors elevation, meds, ice, used to have TENS unit   Effect of Pain on Daily Activities Can't be as active, church, sleeping, walking, grandkids            [facility] PT Assessment - 08/29/14 1007    Assessment   Medical Diagnosis L knee pain    Onset Date/Surgical Date 06/17/14   Next MD Visit 2 weeks   Prior Therapy for UE and long ago for knee   Precautions   Precautions None   Required Braces or Orthoses Other Brace/Splint   Other Brace/Splint knee sleeve   Restrictions   Weight Bearing Restrictions No   Balance Screen   Has the patient fallen in the past 6 months No   Has the patient had a decrease in activity level because of a fear of  falling?  Yes   Is the patient reluctant to leave their home because of a fear of falling?  No   Home Ecologist residence   Prior Function   Level of Independence Independent;Independent with household mobility with device   Observation/Other Assessments-Edema    Edema Circumferential   Circumferential Edema   Circumferential - Right 16.75 inch   Circumferential - Left  18 inch   Sensation   Light Touch Appears Intact   Coordination   Gross Motor Movements are Fluid and Coordinated Yes   Flexibility   Soft Tissue Assessment /Muscle Length --  tight gastroc L    Palpation   Patella mobility painful    Palpation comment not fully tolerated   Ambulation/Gait   Ambulation/Gait Yes   Ambulation/Gait Assistance 6: Modified independent (Device/Increase time)   Ambulation Distance (Feet) 250 Feet   Assistive device Straight cane   Gait Pattern Decreased step length - left;Decreased stance time - left;Decreased hip/knee flexion - left;Decreased weight shift to left   Ambulation Surface Level            PT Education - 08/29/14 1040    Education provided Yes   Education Details PT/POC, HEP and RICE, IFC   Person(s) Educated Patient   Methods Explanation;Demonstration;Handout   Comprehension Verbalized understanding;Need further instruction          PT Short Term Goals - 08/29/14 1323    PT SHORT TERM GOAL #1   Title Pt will be I with initial HEP for knee   Time 4   Period Weeks   Status New   PT SHORT TERM GOAL #2   Title Pt will be able to walk to daughter's house (,20 min) and report mod pain afterwards.    Time 4   Period Weeks   Status New   PT SHORT TERM GOAL #3   Title Pt will tolerate soft tissue work to L knee and report relief.    Time 4   Period Weeks   Status New           PT Long Term Goals - 08/29/14 1326    PT LONG TERM GOAL #1   Title Pt will be I with concepts of RICE, posture and body mechanics.    Time 8    Period Weeks   Status New   PT LONG TERM GOAL #2   Title Pt will be able to walk/stand in community for 30 min with mod pain.    Time 8   Period Weeks   Status New   PT LONG TERM GOAL #3  Title Pt will be able to do housework, manage self with 25% improvement   Time 8   Period Weeks   Status New   PT LONG TERM GOAL #4   Title Pt will score 54% or less on FOTO to demo functional improvement.    Time 8   Period Weeks   Status New   PT LONG TERM GOAL #5   Title Pt will be I with advanced HEP   Time 8   Period Weeks   Status New               Plan - 09/03/2014 1042    Clinical Impression Statement This patient has pain, stiffness, edema and does not tolerate palpation to L knee, extending into ant tib mm, distal quad M>L.  She responded well to IFC with ice.  Limited by pain in low back and L hip as well.  She will benefit from trial of PT to see if  we cna help her pain and inflammation.  She would like to avoid surgery but knows it is likely.    Pt will benefit from skilled therapeutic intervention in order to improve on the following deficits Abnormal gait;Decreased range of motion;Difficulty walking;Increased fascial restricitons;Decreased endurance;Cardiopulmonary status limiting activity;Obesity;Pain;Decreased activity tolerance;Decreased balance;Impaired flexibility;Postural dysfunction;Decreased strength;Decreased mobility   Rehab Potential Good   PT Frequency 2x / week   PT Duration 8 weeks   PT Treatment/Interventions ADLs/Self Care Home Management;Iontophoresis 4mg /ml Dexamethasone;Ultrasound;Cryotherapy;Electrical Stimulation;DME Instruction;Gait training;Stair training;Manual techniques;Neuromuscular re-education;Balance training;Taping;Therapeutic exercise;Therapeutic activities;Functional mobility training;Passive range of motion   PT Next Visit Plan review knee level 1 including gastroc stretch, modalities, GAme Ready, ?tape?   PT Home Exercise Plan given knee level  1 including gastroc stretch   Consulted and Agree with Plan of Care Patient          G-Codes - 09-03-2014 1328    Functional Assessment Tool Used FOTO   Functional Limitation Mobility: Walking and moving around   Mobility: Walking and Moving Around Current Status [phone]) At least 60 percent but less than 80 percent impaired, limited or restricted   Mobility: Walking and Moving Around Goal Status [phone]) At least 40 percent but less than 60 percent impaired, limited or restricted       Problem List Patient Active Problem List   Diagnosis Date Noted  . SINUSITIS, ACUTE 05/07/2010  . UTI 12/27/2009  . VAGINITIS, BACTERIAL 12/07/2009  . URINALYSIS, ABNORMAL 12/07/2009  . URI 05/09/2009  . COLONIC POLYPS 02/01/2009  . HEMORRHOIDS, INTERNAL 02/01/2009  . DIVERTICULOSIS OF COLON 02/01/2009  . ALLERGIC ASTHMA 01/02/2009  . PELVIC PAIN, LEFT 01/02/2009  . ONYCHOMYCOSIS 08/01/2008  . LEG CRAMPS 08/01/2008  . DIABETES MELLITUS, TYPE II, CONTROLLED 10/07/2007  . PLANTAR FASCIITIS, BILATERAL 10/07/2007  . PERIPHERAL EDEMA 09/21/2007  . OBESITY 08/16/2007  . SINUSITIS 08/16/2007  . PERIPHERAL NEUROPATHY 07/27/2007  . ALLERGIC RHINITIS 07/27/2007  . GERD 07/27/2007  . CARPAL TUNNEL SYNDROME, RIGHT 05/27/2007  . SHOULDER PAIN, RIGHT 05/27/2007  . HYPERPARATHYROIDISM, PRIMARY 01/10/2007  . BRONCHITIS, ACUTE 12/24/2006  . DYSLIPIDEMIA 11/22/2006  . DISORDER, 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  . DISORDER, TOBACCO USE 11/22/2006  . HYPERTENSION, BENIGN ESSENTIAL 11/22/2006  . URINARY INCONTINENCE, 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  . PROTEINURIA 11/22/2006  . HYPERCALCEMIA 10/06/2006    Gesenia Bantz 03-Sep-2014, 2:32 PM  [facility] [address] [place], [name], [address] Phone: [phone]   Fax:  [phone]    Raeford Razor, PT 09/03/14 2:32 PM Phone: [phone] Fax: [phone]  Raeford Razor, PT 08/29/2014 2:33 PM Phone:  [phone] Fax: [phone]

## 2014-08-29 NOTE — Patient Instructions (Signed)
v

## 2014-09-01 ENCOUNTER — Ambulatory Visit: Payer: Medicare Other | Admitting: Physical Therapy

## 2014-09-01 DIAGNOSIS — M25562 Pain in left knee: Secondary | ICD-10-CM | POA: Diagnosis not present

## 2014-09-01 DIAGNOSIS — M25462 Effusion, left knee: Secondary | ICD-10-CM | POA: Diagnosis not present

## 2014-09-01 DIAGNOSIS — G8929 Other chronic pain: Secondary | ICD-10-CM

## 2014-09-01 DIAGNOSIS — R262 Difficulty in walking, not elsewhere classified: Secondary | ICD-10-CM

## 2014-09-01 DIAGNOSIS — M5136 Other intervertebral disc degeneration, lumbar region: Secondary | ICD-10-CM

## 2014-09-01 DIAGNOSIS — M51369 Other intervertebral disc degeneration, lumbar region without mention of lumbar back pain or lower extremity pain: Secondary | ICD-10-CM

## 2014-09-01 NOTE — Therapy (Signed)
[place], [name], [address] Phone: [phone]   Fax:  [phone]  Physical Therapy Treatment  Patient Details  Name: Pamela Dalton MRN: [id number] Date of Birth: 07-Jul-1950 Referring Provider:  Vonna Drafts., FNP  Encounter Date: 09/01/2014      PT End of Session - 09/01/14 1118    Visit Number 2   Number of Visits 16   Date for PT Re-Evaluation 10/24/14   PT Start Time 8099   PT Stop Time 1151   PT Time Calculation (min) 54 min   Activity Tolerance Patient tolerated treatment well      Past Medical History  Diagnosis Date  . Hypertension   . Diabetes mellitus without complication   . Asthma   . Nerve pain   . Back pain   . Leg pain   . Arthritis   . Wears glasses   . Full dentures   . GERD (gastroesophageal reflux disease)   . DDD (degenerative disc disease)     Past Surgical History  Procedure Laterality Date  . Thumb surgery- joint removed  2009    Arthritis-rt  . Thyroid cyst excision  2009  . Abdominal hysterectomy  1981    one ovary/ tube removed  . Back surgery, 12/27/2012 l3 replaced cushion, l4/l5 fused  12/27/2012  . Back surgery  2005    L4, L5 fused  . Tonsillectomy    . Colonoscopy    . Shoulder arthroscopy with rotator cuff repair and subacromial decompression Right 10/31/2013    Procedure: Right shoulder arthroscopy rotator cuff debridement, subacromial decompression, distal clavical excision;  Surgeon: Nita Sells, MD;  Location: [place];  Service: Orthopedics;  Laterality: Right;  Right shoulder arthroscopy rotator cuff debridement, subacromial decompression, distal clavical excision    There were no vitals filed for this visit.  Visit Diagnosis:  Knee pain, chronic, left  Difficulty walking  Knee swelling, left  Degenerative disc disease, lumbar      Subjective Assessment - 09/01/14 1058    Subjective 7/10 L knee,  been doing my exercises   Currently in Pain? Yes               [facility] Adult PT Treatment/Exercise - 09/01/14 1106    Knee/Hip Exercises: Aerobic   Stationary Bike NuStep level 4, 8 min UE and LE    Knee/Hip Exercises: Seated   Long Arc Quad Both;1 set;20 reps   Long Arc Quad Limitations ball squeeze   Knee/Hip Exercises: Supine   Quad Sets Strengthening;1 set;20 reps   Heel Slides AAROM;Left;1 set;10 reps   Hip Adduction Isometric Strengthening;Both;1 set;10 reps   Bridges Strengthening;Both;1 set;10 reps   Straight Leg Raises Both;1 set;10 reps   Knee/Hip Exercises: Sidelying   Hip ABduction Strengthening;Both;1 set;10 reps   Clams x 15 reps bilat.    Cryotherapy   Cryotherapy Location Knee  L knee   Electrical Stimulation   Electrical Stimulation Location L knee   Electrical Stimulation Action IFC   Electrical Stimulation Parameters 16   Electrical Stimulation Goals Pain                PT Education - 09/01/14 1141    Education provided No          PT Short Term Goals - 09/01/14 1138    PT SHORT TERM GOAL #1   Title Pt will be I with initial HEP for knee   Status On-going  PT SHORT TERM GOAL #2   Title Pt will be able to walk to daughter's house (,20 min) and report mod pain afterwards.    Status On-going   PT SHORT TERM GOAL #3   Title Pt will tolerate soft tissue work to L knee and report relief.    Status On-going           PT Long Term Goals - 09/01/14 1139    PT LONG TERM GOAL #1   Title Pt will be I with concepts of RICE, posture and body mechanics.    Status On-going   PT LONG TERM GOAL #2   Title Pt will be able to walk/stand in community for 30 min with mod pain.    Status On-going   PT LONG TERM GOAL #3   Title Pt will be able to do housework, manage self with 25% improvement   Status On-going   PT LONG TERM GOAL #4   Title Pt will score 54% or less on FOTO to demo functional improvement.    Status On-going   PT LONG TERM  GOAL #5   Title Pt will be I with advanced HEP   Status On-going               Plan - 09/01/14 1118    Clinical Impression Statement No goals met, 2nd visit.  Tolerated exercise and mobility well today, much easier than eval.    PT Next Visit Plan review knee level 1 including gastroc stretch, modalities, GAme Ready, ?tape?   PT Home Exercise Plan given knee level 1 including gastroc stretch   Consulted and Agree with Plan of Care Patient        Problem List Patient Active Problem List   Diagnosis Date Noted  . SINUSITIS, ACUTE 05/07/2010  . UTI 12/27/2009  . VAGINITIS, BACTERIAL 12/07/2009  . URINALYSIS, ABNORMAL 12/07/2009  . URI 05/09/2009  . COLONIC POLYPS 02/01/2009  . HEMORRHOIDS, INTERNAL 02/01/2009  . DIVERTICULOSIS OF COLON 02/01/2009  . ALLERGIC ASTHMA 01/02/2009  . PELVIC PAIN, LEFT 01/02/2009  . ONYCHOMYCOSIS 08/01/2008  . LEG CRAMPS 08/01/2008  . DIABETES MELLITUS, TYPE II, CONTROLLED 10/07/2007  . PLANTAR FASCIITIS, BILATERAL 10/07/2007  . PERIPHERAL EDEMA 09/21/2007  . OBESITY 08/16/2007  . SINUSITIS 08/16/2007  . PERIPHERAL NEUROPATHY 07/27/2007  . ALLERGIC RHINITIS 07/27/2007  . GERD 07/27/2007  . CARPAL TUNNEL SYNDROME, RIGHT 05/27/2007  . SHOULDER PAIN, RIGHT 05/27/2007  . HYPERPARATHYROIDISM, PRIMARY 01/10/2007  . BRONCHITIS, ACUTE 12/24/2006  . DYSLIPIDEMIA 11/22/2006  . DISORDER, 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  . DISORDER, TOBACCO USE 11/22/2006  . HYPERTENSION, BENIGN ESSENTIAL 11/22/2006  . URINARY INCONTINENCE, 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  . PROTEINURIA 11/22/2006  . HYPERCALCEMIA 10/06/2006    Pamela Dalton 09/01/2014, 11:41 AM  [facility] [address] [place], [name], [address] Phone: [phone]   Fax:  [phone]

## 2014-09-05 DIAGNOSIS — M25562 Pain in left knee: Secondary | ICD-10-CM | POA: Diagnosis not present

## 2014-09-06 ENCOUNTER — Ambulatory Visit: Payer: Medicare Other | Attending: Family Medicine | Admitting: Physical Therapy

## 2014-09-06 VITALS — BP 142/90

## 2014-09-06 DIAGNOSIS — G8929 Other chronic pain: Secondary | ICD-10-CM | POA: Diagnosis not present

## 2014-09-06 DIAGNOSIS — M5136 Other intervertebral disc degeneration, lumbar region: Secondary | ICD-10-CM | POA: Insufficient documentation

## 2014-09-06 DIAGNOSIS — M25562 Pain in left knee: Secondary | ICD-10-CM | POA: Insufficient documentation

## 2014-09-06 DIAGNOSIS — M25462 Effusion, left knee: Secondary | ICD-10-CM

## 2014-09-06 DIAGNOSIS — R262 Difficulty in walking, not elsewhere classified: Secondary | ICD-10-CM | POA: Diagnosis not present

## 2014-09-06 NOTE — Therapy (Signed)
[place]amela [place], [name], [address] Phone: [phone]   Fax:  [phone]  Physical Therapy Treatment  Patient Details  Name: Pamela Dalton MRN: [id number] Date of Birth: February 10, 1951 Referring Provider:  Vonna Drafts., FNP  Encounter Date: 09/06/2014      PT End of Session - 09/06/14 0925    Visit Number 3   Number of Visits 16   Date for PT Re-Evaluation 10/24/14   PT Start Time 0051   Activity Tolerance Patient limited by pain   Behavior During Therapy [facility] for tasks assessed/performed      Past Medical History  Diagnosis Date  . Hypertension   . Diabetes mellitus without complication   . Asthma   . Nerve pain   . Back pain   . Leg pain   . Arthritis   . Wears glasses   . Full dentures   . GERD (gastroesophageal reflux disease)   . DDD (degenerative disc disease)     Past Surgical History  Procedure Laterality Date  . Thumb surgery- joint removed  2009    Arthritis-rt  . Thyroid cyst excision  2009  . Abdominal hysterectomy  1981    one ovary/ tube removed  . Back surgery, 12/27/2012 l3 replaced cushion, l4/l5 fused  12/27/2012  . Back surgery  2005    L4, L5 fused  . Tonsillectomy    . Colonoscopy    . Shoulder arthroscopy with rotator cuff repair and subacromial decompression Right 10/31/2013    Procedure: Right shoulder arthroscopy rotator cuff debridement, subacromial decompression, distal clavical excision;  Surgeon: Nita Sells, MD;  Location: [place];  Service: Orthopedics;  Laterality: Right;  Right shoulder arthroscopy rotator cuff debridement, subacromial decompression, distal clavical excision    There were no vitals filed for this visit.  Visit Diagnosis:  Knee pain, chronic, left  Difficulty walking  Knee swelling, left      Subjective Assessment - 09/06/14 0850    Subjective L knee "gave out" Saturday.  Was up for 5-6 hours and has  had significant pain.  Took meds, but did not help, wore brace as well.  Lauraine Rinne, said to finish PT, gave cortisone shot.     Currently in Pain? Yes   Pain Score 6    Pain Location Knee   Pain Orientation Left   Pain Descriptors / Indicators Sore   Pain Type Chronic pain   Pain Onset More than a month ago   Pain Frequency Constant   Aggravating Factors  flexing knee, walking   Effect of Pain on Daily Activities decr activity, can't sit ot stand without use of UE             [facility] Adult PT Treatment/Exercise - 09/06/14 0912    Knee/Hip Exercises: Supine   Quad Sets Strengthening;Left;1 set;20 reps   Short Arc Quad Sets Strengthening;Left;2 sets;10 reps   Heel Slides AAROM;Left;2 sets;10 reps   Knee Flexion AAROM;Left;1 set;10 reps   Cryotherapy   Number Minutes Cryotherapy 15 Minutes   Cryotherapy Location Knee  L knee   Type of Cryotherapy Ice pack   Electrical Stimulation   Electrical Stimulation Location L knee   Electrical Stimulation Action IFC   Electrical Stimulation Parameters to tol   Electrical Stimulation Goals Pain   Ultrasound   Ultrasound Location L anterior knee, infrapatellar swelling   Ultrasound Parameters 50%, 8 min, 1.2 W/cm2   Ultrasound Goals Pain  PT Education - 09/06/14 0924    Education provided Yes   Education Details pool for walking, exercise   Person(s) Educated Patient   Methods Explanation   Comprehension Verbalized understanding          PT Short Term Goals - 09/01/14 1138    PT SHORT TERM GOAL #1   Title Pt will be I with initial HEP for knee   Status On-going   PT SHORT TERM GOAL #2   Title Pt will be able to walk to daughter's house (,20 min) and report mod pain afterwards.    Status On-going   PT SHORT TERM GOAL #3   Title Pt will tolerate soft tissue work to L knee and report relief.    Status On-going           PT Long Term Goals - 09/01/14 1139    PT LONG TERM GOAL #1   Title Pt will be I with  concepts of RICE, posture and body mechanics.    Status On-going   PT LONG TERM GOAL #2   Title Pt will be able to walk/stand in community for 30 min with mod pain.    Status On-going   PT LONG TERM GOAL #3   Title Pt will be able to do housework, manage self with 25% improvement   Status On-going   PT LONG TERM GOAL #4   Title Pt will score 54% or less on FOTO to demo functional improvement.    Status On-going   PT LONG TERM GOAL #5   Title Pt will be I with advanced HEP   Status On-going               Plan - 09/06/14 0925    Clinical Impression Statement Patient had an increase in pain due to more activity, walking, serving food at [employer] She was well prepared with self care strategies but needed to F/U with MD.  She has not met goals. She will be seen for about 4 more weeks, F/UE with MD and be considered for knee arthroscopy.       PT Next Visit Plan focused on pain control today, retunr to more active ex if able, Give Home TENS (has Rx)   PT Home Exercise Plan given knee level 1 including gastroc stretch   Consulted and Agree with Plan of Care Patient        Problem List Patient Active Problem List   Diagnosis Date Noted  . SINUSITIS, ACUTE 05/07/2010  . UTI 12/27/2009  . VAGINITIS, BACTERIAL 12/07/2009  . URINALYSIS, ABNORMAL 12/07/2009  . URI 05/09/2009  . COLONIC POLYPS 02/01/2009  . HEMORRHOIDS, INTERNAL 02/01/2009  . DIVERTICULOSIS OF COLON 02/01/2009  . ALLERGIC ASTHMA 01/02/2009  . PELVIC PAIN, LEFT 01/02/2009  . ONYCHOMYCOSIS 08/01/2008  . LEG CRAMPS 08/01/2008  . DIABETES MELLITUS, TYPE II, CONTROLLED 10/07/2007  . PLANTAR FASCIITIS, BILATERAL 10/07/2007  . PERIPHERAL EDEMA 09/21/2007  . OBESITY 08/16/2007  . SINUSITIS 08/16/2007  . PERIPHERAL NEUROPATHY 07/27/2007  . ALLERGIC RHINITIS 07/27/2007  . GERD 07/27/2007  . CARPAL TUNNEL SYNDROME, RIGHT 05/27/2007  . SHOULDER PAIN, RIGHT 05/27/2007  . HYPERPARATHYROIDISM, PRIMARY 01/10/2007  .  BRONCHITIS, ACUTE 12/24/2006  . DYSLIPIDEMIA 11/22/2006  . DISORDER, 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  . DISORDER, TOBACCO USE 11/22/2006  . HYPERTENSION, BENIGN ESSENTIAL 11/22/2006  . URINARY INCONTINENCE, 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  . PROTEINURIA 11/22/2006  . HYPERCALCEMIA 10/06/2006    Pamela Dalton 09/06/2014, 9:30 AM  [facility]  [place], [name], [address] Phone: [phone]   Fax:  [phone]

## 2014-09-07 DIAGNOSIS — R351 Nocturia: Secondary | ICD-10-CM | POA: Diagnosis not present

## 2014-09-07 DIAGNOSIS — N3946 Mixed incontinence: Secondary | ICD-10-CM | POA: Diagnosis not present

## 2014-09-12 ENCOUNTER — Ambulatory Visit: Payer: Medicare Other | Admitting: Physical Therapy

## 2014-09-12 DIAGNOSIS — M25562 Pain in left knee: Principal | ICD-10-CM

## 2014-09-12 DIAGNOSIS — M25462 Effusion, left knee: Secondary | ICD-10-CM | POA: Diagnosis not present

## 2014-09-12 DIAGNOSIS — M5136 Other intervertebral disc degeneration, lumbar region: Secondary | ICD-10-CM | POA: Diagnosis not present

## 2014-09-12 DIAGNOSIS — R262 Difficulty in walking, not elsewhere classified: Secondary | ICD-10-CM

## 2014-09-12 DIAGNOSIS — G8929 Other chronic pain: Secondary | ICD-10-CM | POA: Diagnosis not present

## 2014-09-12 NOTE — Therapy (Signed)
[place], [name], [address] Phone: [phone]   Fax:  [phone]  Physical Therapy Treatment  Patient Details  Name: Pamela Dalton MRN: [id number] Date of Birth: 1950/09/17 Referring Provider:  Gentry Fitz, MD  Encounter Date: 09/12/2014      PT End of Session - 09/12/14 1058    Visit Number 4   Number of Visits 16   Date for PT Re-Evaluation 10/24/14   PT Start Time 1028   PT Stop Time 1118   PT Time Calculation (min) 50 min      Past Medical History  Diagnosis Date  . Hypertension   . Diabetes mellitus without complication   . Asthma   . Nerve pain   . Back pain   . Leg pain   . Arthritis   . Wears glasses   . Full dentures   . GERD (gastroesophageal reflux disease)   . DDD (degenerative disc disease)     Past Surgical History  Procedure Laterality Date  . Thumb surgery- joint removed  2009    Arthritis-rt  . Thyroid cyst excision  2009  . Abdominal hysterectomy  1981    one ovary/ tube removed  . Back surgery, 12/27/2012 l3 replaced cushion, l4/l5 fused  12/27/2012  . Back surgery  2005    L4, L5 fused  . Tonsillectomy    . Colonoscopy    . Shoulder arthroscopy with rotator cuff repair and subacromial decompression Right 10/31/2013    Procedure: Right shoulder arthroscopy rotator cuff debridement, subacromial decompression, distal clavical excision;  Surgeon: Nita Sells, MD;  Location: [place];  Service: Orthopedics;  Laterality: Right;  Right shoulder arthroscopy rotator cuff debridement, subacromial decompression, distal clavical excision    There were no vitals filed for this visit.  Visit Diagnosis:  Knee pain, chronic, left  Difficulty walking  Knee swelling, left  Degenerative disc disease, lumbar      Subjective Assessment - 09/12/14 1034    Subjective I went to graduation and forgot my cane. I had to climb to the 10 th  row and had nowhere to stretch it out.   Currently in Pain? Yes   Pain Score 6    Pain Location Knee   Pain Orientation Left   Pain Descriptors / Indicators Aching;Burning;Throbbing  Can be All of the above   Aggravating Factors  flexing, walking, standing   Pain Relieving Factors elevation, ice ,                         [facility] Adult PT Treatment/Exercise - 09/12/14 1051    Knee/Hip Exercises: Aerobic   Stationary Bike Nustep L5 LE x 6 min   Knee/Hip Exercises: Supine   Short Arc Quad Sets Strengthening;Left;10 reps   Short Arc Quad Sets Limitations 3#   Bridges Strengthening;Both;15 reps   Bridges Limitations with ball squeeze   Straight Leg Raises Both;1 set;10 reps   Knee/Hip Exercises: Sidelying   Hip ABduction 15 reps   Modalities   Modalities Iontophoresis   Cryotherapy   Number Minutes Cryotherapy 15 Minutes   Cryotherapy Location Knee  L knee   Type of Cryotherapy Ice pack   Iontophoresis   Type of Iontophoresis Dexamethasone   Location medial left knee   Dose 1.0 cc   Time 6 hours  pt understands precautions  PT Education - 09/12/14 1200    Education provided Yes   Education Details Issued HOME TENS/ IONTO precautions and contraindications   Person(s) Educated Patient   Methods Explanation;Handout   Comprehension Verbalized understanding          PT Short Term Goals - 09/01/14 1138    PT SHORT TERM GOAL #1   Title Pt will be I with initial HEP for knee   Status On-going   PT SHORT TERM GOAL #2   Title Pt will be able to walk to daughter's house (,20 min) and report mod pain afterwards.    Status On-going   PT SHORT TERM GOAL #3   Title Pt will tolerate soft tissue work to L knee and report relief.    Status On-going           PT Long Term Goals - 09/01/14 1139    PT LONG TERM GOAL #1   Title Pt will be I with concepts of RICE, posture and body mechanics.    Status On-going   PT LONG TERM GOAL #2    Title Pt will be able to walk/stand in community for 30 min with mod pain.    Status On-going   PT LONG TERM GOAL #3   Title Pt will be able to do housework, manage self with 25% improvement   Status On-going   PT LONG TERM GOAL #4   Title Pt will score 54% or less on FOTO to demo functional improvement.    Status On-going   PT LONG TERM GOAL #5   Title Pt will be I with advanced HEP   Status On-going               Plan - 09/12/14 1053    Clinical Impression Statement Pt reports less stiffness and she is also able to stand and walk without having to pause for 15 seconds. Pt issued Home Tens today for pain control. Pt verbalizes understanding of contraindications and precautions. Trial of iontophoresis per MD order to medial left knee. Pt instructed in Level 1/2/ knee exercises with increased reps and no c/o increased pain.    PT Next Visit Plan continune ionto, strengthen as tolerated        Problem List Patient Active Problem List   Diagnosis Date Noted  . SINUSITIS, ACUTE 05/07/2010  . UTI 12/27/2009  . VAGINITIS, BACTERIAL 12/07/2009  . URINALYSIS, ABNORMAL 12/07/2009  . URI 05/09/2009  . COLONIC POLYPS 02/01/2009  . HEMORRHOIDS, INTERNAL 02/01/2009  . DIVERTICULOSIS OF COLON 02/01/2009  . ALLERGIC ASTHMA 01/02/2009  . PELVIC PAIN, LEFT 01/02/2009  . ONYCHOMYCOSIS 08/01/2008  . LEG CRAMPS 08/01/2008  . DIABETES MELLITUS, TYPE II, CONTROLLED 10/07/2007  . PLANTAR FASCIITIS, BILATERAL 10/07/2007  . PERIPHERAL EDEMA 09/21/2007  . OBESITY 08/16/2007  . SINUSITIS 08/16/2007  . PERIPHERAL NEUROPATHY 07/27/2007  . ALLERGIC RHINITIS 07/27/2007  . GERD 07/27/2007  . CARPAL TUNNEL SYNDROME, RIGHT 05/27/2007  . SHOULDER PAIN, RIGHT 05/27/2007  . HYPERPARATHYROIDISM, PRIMARY 01/10/2007  . BRONCHITIS, ACUTE 12/24/2006  . DYSLIPIDEMIA 11/22/2006  . DISORDER, 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  . DISORDER, TOBACCO USE 11/22/2006  . HYPERTENSION, BENIGN ESSENTIAL 11/22/2006   . URINARY INCONTINENCE, 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  . PROTEINURIA 11/22/2006  . HYPERCALCEMIA 10/06/2006    Dorene Ar, PTA 09/12/2014, 12:02 PM  [facility] [address] [place], [name], [address] Phone: [phone]   Fax:  [phone]

## 2014-09-12 NOTE — Patient Instructions (Signed)

## 2014-09-14 ENCOUNTER — Ambulatory Visit: Payer: Medicare Other | Admitting: Physical Therapy

## 2014-09-14 DIAGNOSIS — G8929 Other chronic pain: Secondary | ICD-10-CM

## 2014-09-14 DIAGNOSIS — M25462 Effusion, left knee: Secondary | ICD-10-CM | POA: Diagnosis not present

## 2014-09-14 DIAGNOSIS — M25562 Pain in left knee: Principal | ICD-10-CM

## 2014-09-14 DIAGNOSIS — R262 Difficulty in walking, not elsewhere classified: Secondary | ICD-10-CM | POA: Diagnosis not present

## 2014-09-14 DIAGNOSIS — M5136 Other intervertebral disc degeneration, lumbar region: Secondary | ICD-10-CM | POA: Diagnosis not present

## 2014-09-14 NOTE — Therapy (Signed)
[place], [name], [address] Phone: [phone]   Fax:  [phone]  Physical Therapy Treatment  Patient Details  Name: Pamela Dalton MRN: [id number] Date of Birth: 28-Jan-1951 Referring Provider:  Vonna Drafts., FNP  Encounter Date: 09/14/2014      PT End of Session - 09/14/14 1401    Visit Number 5   Number of Visits 16   Date for PT Re-Evaluation 10/24/14   PT Start Time 3235   PT Stop Time 1104   PT Time Calculation (min) 49 min   Activity Tolerance Patient tolerated treatment well;Patient limited by pain      Past Medical History  Diagnosis Date  . Hypertension   . Diabetes mellitus without complication   . Asthma   . Nerve pain   . Back pain   . Leg pain   . Arthritis   . Wears glasses   . Full dentures   . GERD (gastroesophageal reflux disease)   . DDD (degenerative disc disease)     Past Surgical History  Procedure Laterality Date  . Thumb surgery- joint removed  2009    Arthritis-rt  . Thyroid cyst excision  2009  . Abdominal hysterectomy  1981    one ovary/ tube removed  . Back surgery, 12/27/2012 l3 replaced cushion, l4/l5 fused  12/27/2012  . Back surgery  2005    L4, L5 fused  . Tonsillectomy    . Colonoscopy    . Shoulder arthroscopy with rotator cuff repair and subacromial decompression Right 10/31/2013    Procedure: Right shoulder arthroscopy rotator cuff debridement, subacromial decompression, distal clavical excision;  Surgeon: Nita Sells, MD;  Location: [place];  Service: Orthopedics;  Laterality: Right;  Right shoulder arthroscopy rotator cuff debridement, subacromial decompression, distal clavical excision    There were no vitals filed for this visit.  Visit Diagnosis:  Knee pain, chronic, left  Difficulty walking  Knee swelling, left      Subjective Assessment - 09/14/14 1020    Subjective 7/10 sore form on feet at  grocery store yesterday, Used ice elevation but it did not last.  Tries to not use her cane if she can.. She found her cane.    Currently in Pain? Yes   Pain Score 7    Pain Location Calf   Pain Orientation Left;Posterior   Pain Descriptors / Indicators Cramping  cramping started Friday night   Pain Type Chronic pain   Pain Radiating Towards calf    Aggravating Factors  on feet oo long    Pain Relieving Factors ice , elevation TENS, stretches                         [facility] Adult PT Treatment/Exercise - 09/14/14 1028    Knee/Hip Exercises: Aerobic   Stationary Bike Nustep. L% 5 minutes   Knee/Hip Exercises: Supine   Quad Sets 10 reps  5 seconds   Short Arc Music therapist Arc Quad Sets Limitations 3#     Heel Slides AAROM  painful so stipped after 5 reps.     Iontophoresis   Type of Iontophoresis Dexamethasone   Location medial left knee   Dose 1.0 cc   Time 6 hours  pt understands precautions    Manual Therapy   Manual Therapy --  ROC blade used to anterior leg to help edema, fascia   Manual therapy comments Patient  so happy with decreased pain she cried.                  PT Short Term Goals - 09/14/14 1403    PT SHORT TERM GOAL #1   Title Pt will be I with initial HEP for knee   Time 4   Period Weeks   Status On-going   PT SHORT TERM GOAL #2   Title Pt will be able to walk to daughter's house (,20 min) and report mod pain afterwards.    Time 4   Period Weeks   Status Unable to assess   PT SHORT TERM GOAL #3   Title Pt will tolerate soft tissue work to L knee and report relief.    Time 4   Period Weeks   Status Achieved           PT Long Term Goals - 09/01/14 1139    PT LONG TERM GOAL #1   Title Pt will be I with concepts of RICE, posture and body mechanics.    Status On-going   PT LONG TERM GOAL #2   Title Pt will be able to walk/stand in community for 30 min with mod pain.    Status On-going   PT LONG TERM  GOAL #3   Title Pt will be able to do housework, manage self with 25% improvement   Status On-going   PT LONG TERM GOAL #4   Title Pt will score 54% or less on FOTO to demo functional improvement.    Status On-going   PT LONG TERM GOAL #5   Title Pt will be I with advanced HEP   Status On-going               Plan - 09/14/14 1402    Clinical Impression Statement Very sore and painful to touch , Improved with Roc Blade.   PT Next Visit Plan continune ionto, strengthen as tolerated ROC Blade   Consulted and Agree with Plan of Care Patient        Problem List Patient Active Problem List   Diagnosis Date Noted  . SINUSITIS, ACUTE 05/07/2010  . UTI 12/27/2009  . VAGINITIS, BACTERIAL 12/07/2009  . URINALYSIS, ABNORMAL 12/07/2009  . URI 05/09/2009  . COLONIC POLYPS 02/01/2009  . HEMORRHOIDS, INTERNAL 02/01/2009  . DIVERTICULOSIS OF COLON 02/01/2009  . ALLERGIC ASTHMA 01/02/2009  . PELVIC PAIN, LEFT 01/02/2009  . ONYCHOMYCOSIS 08/01/2008  . LEG CRAMPS 08/01/2008  . DIABETES MELLITUS, TYPE II, CONTROLLED 10/07/2007  . PLANTAR FASCIITIS, BILATERAL 10/07/2007  . PERIPHERAL EDEMA 09/21/2007  . OBESITY 08/16/2007  . SINUSITIS 08/16/2007  . PERIPHERAL NEUROPATHY 07/27/2007  . ALLERGIC RHINITIS 07/27/2007  . GERD 07/27/2007  . CARPAL TUNNEL SYNDROME, RIGHT 05/27/2007  . SHOULDER PAIN, RIGHT 05/27/2007  . HYPERPARATHYROIDISM, PRIMARY 01/10/2007  . BRONCHITIS, ACUTE 12/24/2006  . DYSLIPIDEMIA 11/22/2006  . DISORDER, 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  . DISORDER, TOBACCO USE 11/22/2006  . HYPERTENSION, BENIGN ESSENTIAL 11/22/2006  . URINARY INCONTINENCE, 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  . PROTEINURIA 11/22/2006  . HYPERCALCEMIA 10/06/2006    HARRIS,KAREN 09/14/2014, 2:05 PM  [facility] [address] [place], [name], [address] Phone: [phone]   Fax:  [phone]   Melvenia Needles, PTA 09/14/2014 2:05 PM Phone: [phone] Fax:  [phone]

## 2014-09-18 DIAGNOSIS — H40013 Open angle with borderline findings, low risk, bilateral: Secondary | ICD-10-CM | POA: Diagnosis not present

## 2014-09-19 ENCOUNTER — Ambulatory Visit: Payer: Medicare Other | Admitting: Physical Therapy

## 2014-09-19 DIAGNOSIS — G8929 Other chronic pain: Secondary | ICD-10-CM

## 2014-09-19 DIAGNOSIS — R262 Difficulty in walking, not elsewhere classified: Secondary | ICD-10-CM

## 2014-09-19 DIAGNOSIS — M25462 Effusion, left knee: Secondary | ICD-10-CM | POA: Diagnosis not present

## 2014-09-19 DIAGNOSIS — M5136 Other intervertebral disc degeneration, lumbar region: Secondary | ICD-10-CM

## 2014-09-19 DIAGNOSIS — M25562 Pain in left knee: Secondary | ICD-10-CM | POA: Diagnosis not present

## 2014-09-19 NOTE — Therapy (Signed)
[place]amela [place], [name], [address] Phone: [phone]   Fax:  [phone]  Physical Therapy Treatment  Patient Details  Name: Pamela Dalton MRN: [id number] Date of Birth: [birth year] Referring Provider:  Vonna Drafts., FNP  Encounter Date: 09/19/2014      PT End of Session - 09/19/14 1004    Visit Number 6   Number of Visits 16   Date for PT Re-Evaluation 10/24/14   PT Start Time 0915   PT Stop Time 1003   PT Time Calculation (min) 48 min   Activity Tolerance Patient tolerated treatment well   Behavior During Therapy [facility] for tasks assessed/performed      Past Medical History  Diagnosis Date  . Hypertension   . Diabetes mellitus without complication   . Asthma   . Nerve pain   . Back pain   . Leg pain   . Arthritis   . Wears glasses   . Full dentures   . GERD (gastroesophageal reflux disease)   . DDD (degenerative disc disease)     Past Surgical History  Procedure Laterality Date  . Thumb surgery- joint removed  2009    Arthritis-rt  . Thyroid cyst excision  2009  . Abdominal hysterectomy  1981    one ovary/ tube removed  . Back surgery, 12/27/2012 l3 replaced cushion, l4/l5 fused  12/27/2012  . Back surgery  2005    L4, L5 fused  . Tonsillectomy    . Colonoscopy    . Shoulder arthroscopy with rotator cuff repair and subacromial decompression Right 10/31/2013    Procedure: Right shoulder arthroscopy rotator cuff debridement, subacromial decompression, distal clavical excision;  Surgeon: Nita Sells, MD;  Location: [place];  Service: Orthopedics;  Laterality: Right;  Right shoulder arthroscopy rotator cuff debridement, subacromial decompression, distal clavical excision    There were no vitals filed for this visit.  Visit Diagnosis:  Knee pain, chronic, left  Difficulty walking  Knee swelling, left  Degenerative disc disease, lumbar       Subjective Assessment - 09/19/14 0914    Subjective That tool really worked and I haven't been wearing the brace as much since last week!          [facility] PT Treatment/Exercise - 09/19/14 0945    Knee/Hip Exercises: Stretches   Active Hamstring Stretch 3 reps;30 seconds   ITB Stretch 2 reps;30 seconds   Gastroc Stretch 3 reps;30 seconds   Gastroc Stretch Limitations Rocker board   Knee/Hip Exercises: Aerobic   Stationary Bike NuStep LE only 6 min L 5   Knee/Hip Exercises: Standing   Forward Step Up Left;1 set;10 reps;Hand Hold: 2   Wall Squat 15 reps   Knee/Hip Exercises: Supine   Straight Leg Raises Both;1 set;10 reps   Straight Leg Raise with External Rotation Both;1 set;10 reps   Knee Flexion AAROM   Knee/Hip Exercises: Sidelying   Hip ABduction Strengthening;Both;1 set;10 reps   Iontophoresis   Type of Iontophoresis Dexamethasone   Location medial left knee   Dose 1.0 cc   Time 6 hours  pt understands precautions    Manual Therapy   Manual Therapy Soft tissue mobilization;Myofascial release;Taping   Manual therapy comments IASTM to quads and patellar tendon, ant tibialis   Soft tissue mobilization Se above, used hands post to flush   Myofascial Release See above   Kinesiotex Facilitate Muscle   Kinesiotix   Facilitate Muscle  Quads, 2 C's  starting below patella and up to prox thigh                PT Education - 09/19/14 1003    Education provided Yes   Education Details SLR 3 way and standing wall squat for HEP   Person(s) Educated Patient   Methods Explanation;Handout;Demonstration   Comprehension Verbalized understanding;Returned demonstration          PT Short Term Goals - 09/19/14 0914    PT SHORT TERM GOAL #1   Title Pt will be I with initial HEP for knee   Status Achieved   PT SHORT TERM GOAL #2   Title Pt will be able to walk to daughter's house (,20 min) and report mod pain afterwards.    Baseline last week, pain usually severe after  walking that amt   Status On-going   PT SHORT TERM GOAL #3   Title Pt will tolerate soft tissue work to L knee and report relief.    Status Achieved           PT Long Term Goals - 09/19/14 0916    PT LONG TERM GOAL #1   Title Pt will be I with concepts of RICE, posture and body mechanics.    Status Achieved   PT LONG TERM GOAL #2   Title Pt will be able to walk/stand in community for 30 min with mod pain.    Status Achieved   PT LONG TERM GOAL #3   Title Pt will be able to do housework, manage self with 25% improvement   Status Achieved   PT LONG TERM GOAL #4   Title Pt will score 54% or less on FOTO to demo functional improvement.    Status Unable to assess   PT LONG TERM GOAL #5   Title Pt will be I with advanced HEP   Status On-going               Plan - 09/19/14 1005    Clinical Impression Statement Patient responded well to Roc blade again today, taped post to activate quads.  She reviewed full HEP and has met goals STG 1-3 and LTG 1-3.  Will be DC Thursday as she will be watching grandbaby full time for the next 8 weeks..   PT Next Visit Plan continune ionto, strengthen as tolerated ROC Blade   PT Home Exercise Plan gave hip SLR and standing wall squat. Has enough HEP.  FOTO/GCODE   Consulted and Agree with Plan of Care Patient        Problem List Patient Active Problem List   Diagnosis Date Noted  . SINUSITIS, ACUTE 05/07/2010  . UTI 12/27/2009  . VAGINITIS, BACTERIAL 12/07/2009  . URINALYSIS, ABNORMAL 12/07/2009  . URI 05/09/2009  . COLONIC POLYPS 02/01/2009  . HEMORRHOIDS, INTERNAL 02/01/2009  . DIVERTICULOSIS OF COLON 02/01/2009  . ALLERGIC ASTHMA 01/02/2009  . PELVIC PAIN, LEFT 01/02/2009  . ONYCHOMYCOSIS 08/01/2008  . LEG CRAMPS 08/01/2008  . DIABETES MELLITUS, TYPE II, CONTROLLED 10/07/2007  . PLANTAR FASCIITIS, BILATERAL 10/07/2007  . PERIPHERAL EDEMA 09/21/2007  . OBESITY 08/16/2007  . SINUSITIS 08/16/2007  . PERIPHERAL NEUROPATHY  07/27/2007  . ALLERGIC RHINITIS 07/27/2007  . GERD 07/27/2007  . CARPAL TUNNEL SYNDROME, RIGHT 05/27/2007  . SHOULDER PAIN, RIGHT 05/27/2007  . HYPERPARATHYROIDISM, PRIMARY 01/10/2007  . BRONCHITIS, ACUTE 12/24/2006  . DYSLIPIDEMIA 11/22/2006  . DISORDER, 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  . DISORDER, TOBACCO USE 11/22/2006  . HYPERTENSION, BENIGN ESSENTIAL 11/22/2006  .  URINARY INCONTINENCE, 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  . PROTEINURIA 11/22/2006  . HYPERCALCEMIA 10/06/2006    Ruari Duggan 09/19/2014, 10:14 AM  [facility] [address] [place], [name], [address] Phone: [phone]   Fax:  [phone]   Raeford Razor, PT 09/19/2014 10:15 AM Phone: [phone] Fax: [phone]

## 2014-09-19 NOTE — Patient Instructions (Signed)
Quad Strength: Quarter Squat   With feet shoulder-width apart and back against wall, slide down wall until knees are at 30-45. Return. Repeat __10__ times or for ____ minutes. Do __3-5__ sessions per week CAUTION: You should not bend knees deep enough to cause pain.  http://cc.exer.us/10   Copyright  VHI. All rights reserved.     http://orth.exer.us/98   Copyright  VHI. All rights reserved.  Hip Adduction: Leg Lift (Eccentric) - Side-Lying   Lie on side with top leg bent, foot flat behind lower leg. Quickly lift lower leg. Slowly lower for 3-5 seconds. __10_ reps per set, __2-3_ sets per day, __3_ days per week. Add ___ lbs when you achieve ___ repetitions.  Copyright  VHI. All rights reserved.  Abduction: Side Leg Lift (Eccentric) - Side-Lying   Lie on side. Lift top leg slightly higher than shoulder level. Keep top leg straight with body, toes pointing forward. Slowly lower for 3-5 seconds. _10__ reps per set, __2-3_ sets per day, _3-5__ days per week. Add ___ lbs when you achieve ___ repetitions.  Copyright  VHI. All rights reserved.  Strengthening: Straight Leg Raise (Phase 1)   Tighten muscles on front of right thigh, then lift leg __6-10__ inches from surface, keeping knee locked.  Repeat __10__ times per set. Do _2-3___ sets per session. Do _3-5___ sessions per week http://orth.exer.us/614   Copyright  VHI. All rights reserved.

## 2014-09-21 ENCOUNTER — Ambulatory Visit: Payer: Medicare Other | Admitting: Physical Therapy

## 2014-09-21 DIAGNOSIS — M5136 Other intervertebral disc degeneration, lumbar region: Secondary | ICD-10-CM | POA: Diagnosis not present

## 2014-09-21 DIAGNOSIS — M25562 Pain in left knee: Principal | ICD-10-CM

## 2014-09-21 DIAGNOSIS — G8929 Other chronic pain: Secondary | ICD-10-CM

## 2014-09-21 DIAGNOSIS — R262 Difficulty in walking, not elsewhere classified: Secondary | ICD-10-CM

## 2014-09-21 DIAGNOSIS — M25462 Effusion, left knee: Secondary | ICD-10-CM

## 2014-09-21 NOTE — Therapy (Signed)
[place], [name], [address] Phone: [phone]   Fax:  [phone]  Physical Therapy Treatment  Patient Details  Name: Pamela Dalton MRN: [id number] Date of Birth: Aug 19, 1950 Referring Provider:  Vonna Drafts., FNP  Encounter Date: 09/21/2014      PT End of Session - 09/21/14 1751    Visit Number 7   Number of Visits 16   Date for PT Re-Evaluation 10/24/14   PT Start Time 0315   PT Stop Time 1105   PT Time Calculation (min) 50 min   Activity Tolerance Patient tolerated treatment well   Behavior During Therapy [facility] for tasks assessed/performed      Past Medical History  Diagnosis Date  . Hypertension   . Diabetes mellitus without complication   . Asthma   . Nerve pain   . Back pain   . Leg pain   . Arthritis   . Wears glasses   . Full dentures   . GERD (gastroesophageal reflux disease)   . DDD (degenerative disc disease)     Past Surgical History  Procedure Laterality Date  . Thumb surgery- joint removed  2009    Arthritis-rt  . Thyroid cyst excision  2009  . Abdominal hysterectomy  1981    one ovary/ tube removed  . Back surgery, 12/27/2012 l3 replaced cushion, l4/l5 fused  12/27/2012  . Back surgery  2005    L4, L5 fused  . Tonsillectomy    . Colonoscopy    . Shoulder arthroscopy with rotator cuff repair and subacromial decompression Right 10/31/2013    Procedure: Right shoulder arthroscopy rotator cuff debridement, subacromial decompression, distal clavical excision;  Surgeon: Nita Sells, MD;  Location: [place];  Service: Orthopedics;  Laterality: Right;  Right shoulder arthroscopy rotator cuff debridement, subacromial decompression, distal clavical excision    There were no vitals filed for this visit.  Visit Diagnosis:  Knee pain, chronic, left  Difficulty walking  Knee swelling, left      Subjective Assessment - 09/21/14 1022    Subjective No pain, still not wearing the brace.  Had calf cramp this am .   Currently in Pain? No/denies   Pain Score 0-No pain   Pain Location Knee   Pain Orientation Left;Posterior   Pain Descriptors / Indicators Cramping   Pain Type Chronic pain   Pain Radiating Towards Medial knee, distal quad feel lumpy give me a little trouble   Pain Frequency Occasional   Aggravating Factors  woke up with cramp   Pain Relieving Factors PT                         [facility] Adult PT Treatment/Exercise - 09/21/14 1026    Knee/Hip Exercises: Stretches   Active Hamstring Stretch 3 reps;30 seconds   Knee/Hip Exercises: Aerobic   Stationary Bike Nustep L5 6 minutes   Knee/Hip Exercises: Standing   Wall Squat 10 reps  instruction  for leg position   Knee/Hip Exercises: Supine   Quad Sets Both;10 reps  2 sets   Straight Leg Raises --  2 sets 10 reps each   Knee Flexion Limitations 120 AROM   Iontophoresis   Type of Iontophoresis Dexamethasone   Location medial left knee   Dose 1.0 cc   Time 6 hours  pt understands precautions 5 minutes to apply   Manual Therapy   Manual Therapy Soft tissue mobilization   Manual  therapy comments IASTM to quads and patellar tendon, ant tibialis   Soft tissue mobilization Se above, used hands post to flush   Myofascial Release See above                  PT Short Term Goals - 09/21/14 1759    PT SHORT TERM GOAL #1   Title Pt will be I with initial HEP for knee   Time 4   Period Weeks   Status Achieved   PT SHORT TERM GOAL #2   Title Pt will be able to walk to daughter's house (,20 min) and report mod pain afterwards.    Baseline last week, pain usually severe after walking that amt  Has not  walked to her daugther's house  guesses it would be a 5/10 as in grocery store.   Time 4   Period Weeks   Status Partially Met   PT SHORT TERM GOAL #3   Title Pt will tolerate soft tissue work to L knee and report relief.    Time 4    Period Weeks   Status Achieved           PT Long Term Goals - 09/21/14 1800    PT LONG TERM GOAL #1   Title Pt will be I with concepts of RICE, posture and body mechanics.    Time 8   Period Weeks   Status Achieved   PT LONG TERM GOAL #2   Title Pt will be able to walk/stand in community for 30 min with mod pain.    Baseline 5/10   Time 8   Period Weeks   Status Achieved   PT LONG TERM GOAL #3   Title Pt will be able to do housework, manage self with 25% improvement   Time 8   Period Weeks   Status Achieved   PT LONG TERM GOAL #4   Title Pt will score 54% or less on FOTO to demo functional improvement.    Baseline 20% limitation   Time 8   Period Weeks   Status Achieved   PT LONG TERM GOAL #5   Title Pt will be I with advanced HEP   Baseline independent   Time 8   Period Weeks   Status Achieved                 Plan - 09/21/14 1752    Clinical Impression Statement Last visit today.  FOTO score improved to 20% limitation.  Brace not needed. cane uses for long distances, uneven ground. Goals met see goals following.   PT Next Visit Plan Discharge to home program.  Patient very happy.Per PT plan.        Problem List Patient Active Problem List   Diagnosis Date Noted  . SINUSITIS, ACUTE 05/07/2010  . UTI 12/27/2009  . VAGINITIS, BACTERIAL 12/07/2009  . URINALYSIS, ABNORMAL 12/07/2009  . URI 05/09/2009  . COLONIC POLYPS 02/01/2009  . HEMORRHOIDS, INTERNAL 02/01/2009  . DIVERTICULOSIS OF COLON 02/01/2009  . ALLERGIC ASTHMA 01/02/2009  . PELVIC PAIN, LEFT 01/02/2009  . ONYCHOMYCOSIS 08/01/2008  . LEG CRAMPS 08/01/2008  . DIABETES MELLITUS, TYPE II, CONTROLLED 10/07/2007  . PLANTAR FASCIITIS, BILATERAL 10/07/2007  . PERIPHERAL EDEMA 09/21/2007  . OBESITY 08/16/2007  . SINUSITIS 08/16/2007  . PERIPHERAL NEUROPATHY 07/27/2007  . ALLERGIC RHINITIS 07/27/2007  . GERD 07/27/2007  . CARPAL TUNNEL SYNDROME, RIGHT 05/27/2007  . SHOULDER PAIN, RIGHT  05/27/2007  . HYPERPARATHYROIDISM, PRIMARY 01/10/2007  .  BRONCHITIS, ACUTE 12/24/2006  . DYSLIPIDEMIA 11/22/2006  . DISORDER, 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  . DISORDER, TOBACCO USE 11/22/2006  . HYPERTENSION, BENIGN ESSENTIAL 11/22/2006  . URINARY INCONTINENCE, 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  . PROTEINURIA 11/22/2006  . HYPERCALCEMIA 10/06/2006    Jaci Desanto 09/21/2014, 6:01 PM  [facility] [address] [place], [name], [address] Phone: [phone]   Fax:  [phone]   PHYSICAL THERAPY DISCHARGE SUMMARY  Visits from Start of Care: 7  Current functional level related to goals / functional outcomes: See above for goals met   Remaining deficits: Pain at times with walking   Education / Equipment: HEP, RICE, physical activity Plan: Patient agrees to discharge.  Patient goals were met. Patient is being discharged due to meeting the stated rehab goals.  ?????    Raeford Razor, PT 09/25/2014 7:59 AM Phone: [phone] Fax: [phone]

## 2014-09-22 DIAGNOSIS — M25562 Pain in left knee: Secondary | ICD-10-CM | POA: Diagnosis not present

## 2014-09-22 DIAGNOSIS — G8929 Other chronic pain: Secondary | ICD-10-CM | POA: Diagnosis not present

## 2014-09-22 DIAGNOSIS — M25462 Effusion, left knee: Secondary | ICD-10-CM | POA: Diagnosis not present

## 2014-09-22 DIAGNOSIS — R262 Difficulty in walking, not elsewhere classified: Secondary | ICD-10-CM | POA: Diagnosis not present

## 2014-09-22 DIAGNOSIS — M5136 Other intervertebral disc degeneration, lumbar region: Secondary | ICD-10-CM | POA: Diagnosis not present

## 2014-09-26 ENCOUNTER — Encounter: Payer: Medicare Other | Admitting: Physical Therapy

## 2014-09-28 ENCOUNTER — Ambulatory Visit: Payer: Medicare Other | Admitting: Physical Therapy

## 2014-10-12 DIAGNOSIS — M25562 Pain in left knee: Secondary | ICD-10-CM | POA: Diagnosis not present

## 2014-10-25 DIAGNOSIS — N3946 Mixed incontinence: Secondary | ICD-10-CM | POA: Diagnosis not present

## 2014-10-25 DIAGNOSIS — R351 Nocturia: Secondary | ICD-10-CM | POA: Diagnosis not present

## 2014-10-26 DIAGNOSIS — E139 Other specified diabetes mellitus without complications: Secondary | ICD-10-CM | POA: Diagnosis not present

## 2014-10-26 DIAGNOSIS — I10 Essential (primary) hypertension: Secondary | ICD-10-CM | POA: Diagnosis not present

## 2014-10-26 DIAGNOSIS — F419 Anxiety disorder, unspecified: Secondary | ICD-10-CM | POA: Diagnosis not present

## 2014-11-12 DIAGNOSIS — M25562 Pain in left knee: Secondary | ICD-10-CM | POA: Diagnosis not present

## 2014-11-13 DIAGNOSIS — L821 Other seborrheic keratosis: Secondary | ICD-10-CM | POA: Diagnosis not present

## 2014-11-27 ENCOUNTER — Other Ambulatory Visit (HOSPITAL_COMMUNITY): Payer: Self-pay | Admitting: Nurse Practitioner

## 2014-11-27 DIAGNOSIS — Z1231 Encounter for screening mammogram for malignant neoplasm of breast: Secondary | ICD-10-CM

## 2014-11-29 ENCOUNTER — Ambulatory Visit (HOSPITAL_COMMUNITY)
Admission: RE | Admit: 2014-11-29 | Discharge: 2014-11-29 | Disposition: A | Discharge status: Home / Self Care | Payer: Medicare Other | Source: Ambulatory Visit | Attending: Nurse Practitioner | Admitting: Nurse Practitioner

## 2014-11-29 DIAGNOSIS — Z1231 Encounter for screening mammogram for malignant neoplasm of breast: Secondary | ICD-10-CM | POA: Insufficient documentation

## 2014-12-13 DIAGNOSIS — M25562 Pain in left knee: Secondary | ICD-10-CM | POA: Diagnosis not present

## 2015-01-12 DIAGNOSIS — M25562 Pain in left knee: Secondary | ICD-10-CM | POA: Diagnosis not present

## 2015-01-18 DIAGNOSIS — Z1389 Encounter for screening for other disorder: Secondary | ICD-10-CM | POA: Diagnosis not present

## 2015-01-18 DIAGNOSIS — Z23 Encounter for immunization: Secondary | ICD-10-CM | POA: Diagnosis not present

## 2015-01-18 DIAGNOSIS — Z6841 Body Mass Index (BMI) 40.0 and over, adult: Secondary | ICD-10-CM | POA: Diagnosis not present

## 2015-01-18 DIAGNOSIS — E139 Other specified diabetes mellitus without complications: Secondary | ICD-10-CM | POA: Diagnosis not present

## 2015-02-12 DIAGNOSIS — M25562 Pain in left knee: Secondary | ICD-10-CM | POA: Diagnosis not present

## 2015-03-14 DIAGNOSIS — M25562 Pain in left knee: Secondary | ICD-10-CM | POA: Diagnosis not present

## 2015-03-20 DIAGNOSIS — H40013 Open angle with borderline findings, low risk, bilateral: Secondary | ICD-10-CM | POA: Diagnosis not present

## 2015-03-20 DIAGNOSIS — E119 Type 2 diabetes mellitus without complications: Secondary | ICD-10-CM | POA: Diagnosis not present

## 2015-06-28 ENCOUNTER — Telehealth: Payer: Self-pay

## 2015-06-28 NOTE — Telephone Encounter (Signed)
Received from [facility] 10 pages for GI Historical Provider

## 2015-06-29 ENCOUNTER — Telehealth: Payer: Self-pay | Admitting: Gastroenterology

## 2015-06-29 NOTE — Telephone Encounter (Signed)
Received [facility] records and placed on Dr. Eugenia Pancoast desk for review.

## 2015-07-02 NOTE — Telephone Encounter (Signed)
Dr. Eugenia Pancoast reviewed records and has accepted patient. Next colon 01/2019. Recall colon entered and patient notified.

## 2015-10-25 ENCOUNTER — Other Ambulatory Visit: Payer: Self-pay | Admitting: Family Medicine

## 2015-10-25 DIAGNOSIS — Z1231 Encounter for screening mammogram for malignant neoplasm of breast: Secondary | ICD-10-CM

## 2015-11-30 ENCOUNTER — Ambulatory Visit
Admission: RE | Admit: 2015-11-30 | Discharge: 2015-11-30 | Disposition: A | Discharge status: Home / Self Care | Payer: Medicare Other | Source: Ambulatory Visit | Attending: Family Medicine | Admitting: Family Medicine

## 2015-11-30 DIAGNOSIS — Z1231 Encounter for screening mammogram for malignant neoplasm of breast: Secondary | ICD-10-CM

## 2016-08-13 ENCOUNTER — Other Ambulatory Visit: Payer: Self-pay | Admitting: Physician Assistant

## 2016-09-02 ENCOUNTER — Other Ambulatory Visit: Payer: Self-pay | Admitting: Physician Assistant

## 2016-09-08 ENCOUNTER — Other Ambulatory Visit: Payer: Self-pay | Admitting: Physician Assistant

## 2016-11-12 ENCOUNTER — Other Ambulatory Visit: Payer: Self-pay | Admitting: Specialist

## 2016-11-12 DIAGNOSIS — Z1231 Encounter for screening mammogram for malignant neoplasm of breast: Secondary | ICD-10-CM

## 2016-12-01 ENCOUNTER — Ambulatory Visit
Admission: RE | Admit: 2016-12-01 | Discharge: 2016-12-01 | Disposition: A | Discharge status: Home / Self Care | Payer: Medicare Other | Source: Ambulatory Visit | Attending: Specialist | Admitting: Specialist

## 2016-12-01 DIAGNOSIS — Z1231 Encounter for screening mammogram for malignant neoplasm of breast: Secondary | ICD-10-CM

## 2017-04-24 ENCOUNTER — Other Ambulatory Visit: Payer: Self-pay | Admitting: Otolaryngology

## 2017-10-26 ENCOUNTER — Other Ambulatory Visit: Payer: Self-pay | Admitting: Specialist

## 2017-10-26 DIAGNOSIS — Z1231 Encounter for screening mammogram for malignant neoplasm of breast: Secondary | ICD-10-CM

## 2017-12-02 ENCOUNTER — Ambulatory Visit
Admission: RE | Admit: 2017-12-02 | Discharge: 2017-12-02 | Disposition: A | Discharge status: Home / Self Care | Payer: Medicare HMO | Source: Ambulatory Visit | Attending: Specialist | Admitting: Specialist

## 2017-12-02 DIAGNOSIS — Z1231 Encounter for screening mammogram for malignant neoplasm of breast: Secondary | ICD-10-CM

## 2017-12-24 ENCOUNTER — Encounter: Payer: Self-pay | Admitting: Neurology

## 2017-12-24 ENCOUNTER — Ambulatory Visit (INDEPENDENT_AMBULATORY_CARE_PROVIDER_SITE_OTHER): Payer: Medicare HMO | Admitting: Neurology

## 2017-12-24 ENCOUNTER — Telehealth: Payer: Self-pay | Admitting: Neurology

## 2017-12-24 DIAGNOSIS — G4489 Other headache syndrome: Secondary | ICD-10-CM | POA: Diagnosis not present

## 2017-12-24 DIAGNOSIS — G43019 Migraine without aura, intractable, without status migrainosus: Secondary | ICD-10-CM | POA: Diagnosis not present

## 2017-12-24 HISTORY — DX: Migraine without aura, intractable, without status migrainosus: G43.019

## 2017-12-24 MED ORDER — TOPIRAMATE 25 MG PO TABS: ORAL_TABLET | ORAL | 3 refills | Status: DC

## 2017-12-24 MED ORDER — AMITRIPTYLINE HCL 25 MG PO TABS: 75.0000 mg | ORAL_TABLET | Freq: Every day | ORAL | 1 refills | Status: DC

## 2017-12-24 NOTE — Telephone Encounter (Signed)
Mcarthur Rossetti Josem Kaufmann: [phone] (exp. 12/24/17 to 01/23/18)/medicaid order sent to GI. They will reach out to the pt to schedule.

## 2017-12-24 NOTE — Progress Notes (Signed)
Reason for visit: Migraine headache  Referring physician: Dr. Stasia Dalton is a 67 y.o. female  History of present illness:  Pamela Dalton is a 67 year old right-handed black female with a history of migraine headaches dating back to her early 58s.  The patient generally will have a headache once every 3 weeks or so, the headaches are usually not extremely severe and usually in the bitemporal regions associated with a throbbing sensation.  The patient may have photophobia and phonophobia, she usually does not get blurred vision, but she may have some nausea and vomiting.  Stress and spicy foods are sometimes triggers for the headache.  About a month ago, the patient had a slow build up of a severe headache.  The patient was having more frequent headaches 3 weeks prior to the onset of the more severe headache, and then eventually the headache has become daily since that time.  The headache was quite severe, unlike any headaches that she has had previously.  The headache includes the entire head, there was neck stiffness, blurred vision, severe nausea vomiting and severe photophobia and phonophobia.  The patient was given a trial on a prednisone Dosepak which did not help.  She was placed on amitriptyline, she currently takes 3.5 of the 25 mg tablets daily without benefit.  She has been given Imitrex nasal spray which does seem to be helpful.  The patient may take Excedrin Migraine as well and she has Zofran for nausea.  The patient does have a history of diabetes, but her hemoglobin A1c runs around 6.  The patient is sent to this office for an evaluation.  She does have a family history of migraine headaches in the daughter.  The patient does report some numbness of the hands bilaterally, but no numbness in the feet.  She denies any weakness.  She indicates that she does drink ice tea sometimes, but does not take in a lot of caffeinated products during the day otherwise.  Past Medical  History:  Diagnosis Date  . Arthritis   . Asthma   . Back pain   . DDD (degenerative disc disease)   . Diabetes mellitus without complication ([place])   . Full dentures   . GERD (gastroesophageal reflux disease)   . Hypertension   . Leg pain   . Nerve pain   . Wears glasses     Past Surgical History:  Procedure Laterality Date  . ABDOMINAL HYSTERECTOMY  1981   one ovary/ tube removed  . BACK SURGERY  2005   L4, L5 fused  . Back surgery, 12/27/2012 L3 replaced cushion, L4/L5 fused  12/27/2012  . COLONOSCOPY    . SHOULDER ARTHROSCOPY WITH ROTATOR CUFF REPAIR AND SUBACROMIAL DECOMPRESSION Right 10/31/2013   Procedure: Right shoulder arthroscopy rotator cuff debridement, subacromial decompression, distal clavical excision;  Surgeon: Nita Sells, MD;  Location: [facility];  Service: Orthopedics;  Laterality: Right;  Right shoulder arthroscopy rotator cuff debridement, subacromial decompression, distal clavical excision  . Thumb surgery- joint removed  2009   Arthritis-rt  . THYROID CYST EXCISION  2009  . TONSILLECTOMY      Family History  Problem Relation Age of Onset  . Alzheimer's disease Father   . Breast cancer Neg Hx     Social history:  reports that she quit smoking about 14 years ago. She has never used smokeless tobacco. She reports that she does not drink alcohol or use drugs.  Medications:  Prior to  Admission medications   Medication Sig Start Date End Date Taking? Authorizing Provider  amLODipine (NORVASC) 5 MG tablet Take 5 mg by mouth daily.  11/02/12  Yes [provider]  aspirin 81 MG tablet Take 81 mg by mouth daily.   Yes [provider]  benazepril (LOTENSIN) 10 MG tablet Take 10 mg by mouth daily.  11/02/12  Yes [provider]  cyclobenzaprine (FLEXERIL) 10 MG tablet Take 10 mg by mouth 3 (three) times daily as needed for muscle spasms.  10/16/12  Yes [provider]  esomeprazole (NEXIUM) 20 MG  capsule Take 20 mg by mouth daily as needed (acid reflux).    Yes [provider]  Fluticasone-Salmeterol (ADVAIR) 100-50 MCG/DOSE AEPB Inhale 1 puff into the lungs every 12 (twelve) hours.   Yes [provider]  folic acid (FOLVITE) 1 MG tablet Take 1 mg by mouth daily.   Yes [provider]  hydrochlorothiazide (HYDRODIURIL) 25 MG tablet Take 25 mg by mouth daily.  11/02/12  Yes [provider]  LORazepam (ATIVAN) 1 MG tablet Take 1 mg by mouth every 8 (eight) hours.   Yes [provider]  losartan-hydrochlorothiazide (HYZAAR) 100-25 MG per tablet Take 1 tablet by mouth daily.   Yes [provider]  montelukast (SINGULAIR) 10 MG tablet Take 10 mg by mouth at bedtime.  11/02/12  Yes [provider]  Olopatadine HCl (PATADAY) 0.2 % SOLN Place 1 drop into both eyes daily.   Yes [provider]  oxybutynin (DITROPAN) 5 MG tablet Take 1 tablet (5 mg total) by mouth 3 (three) times daily. 01/25/14  Yes Lavonia Drafts, MD  oxyCODONE-acetaminophen (ROXICET) 5-325 MG per tablet Take 1-2 tablets by mouth every 4 (four) hours as needed for severe pain. 10/31/13  Yes Grier Mitts, PA-C  valACYclovir (VALTREX) 500 MG tablet Take 500 mg by mouth daily.   Yes [provider]      Allergies  Allergen Reactions  . Codeine     REACTION: Rash and itching    ROS:  Out of a complete 14 system review of symptoms, the patient complains only of the following symptoms, and all other reviewed systems are negative.  Weight gain Moles Incontinence of the bladder Easy bruising, easy bleeding Joint pain, muscle cramps, aching muscles Allergies Numbness Insomnia  Blood pressure 115/82, pulse 74, height 5\' 2"  (1.575 m), weight 220 lb (99.8 kg).  Physical Exam  General: The patient is alert and cooperative at the time of the examination.  The patient is markedly obese.  Eyes: Pupils are equal, round, and reactive to  light. Discs are flat bilaterally.  Neck: The neck is supple, no carotid bruits are noted.  Respiratory: The respiratory examination is clear.  Cardiovascular: The cardiovascular examination reveals a regular rate and rhythm, no obvious murmurs or rubs are noted.  Neuromuscular: Range of movement of the cervical spine is full, no crepitus is noted in the temporal nuclear joints.  The patient has incomplete abduction of the right arm, restriction of movement at the right shoulder.  She is able to lift the arm to about 115 degrees.  Skin: Extremities are without significant edema.  Neurologic Exam  Mental status: The patient is alert and oriented x 3 at the time of the examination. The patient has apparent normal recent and remote memory, with an apparently normal attention span and concentration ability.  Cranial nerves: Facial symmetry is present. There is good sensation of the face to pinprick and soft  touch bilaterally. The strength of the facial muscles and the muscles to head turning and shoulder shrug are normal bilaterally. Speech is well enunciated, no aphasia or dysarthria is noted. Extraocular movements are full. Visual fields are full. The tongue is midline, and the patient has symmetric elevation of the soft palate. No obvious hearing deficits are noted.  Motor: The motor testing reveals 5 over 5 strength of all 4 extremities. Good symmetric motor tone is noted throughout.  Sensory: Sensory testing is intact to pinprick, soft touch, vibration sensation, and position sense on all 4 extremities. No evidence of extinction is noted.  Coordination: Cerebellar testing reveals good finger-nose-finger and heel-to-shin bilaterally.  Gait and station: Gait is normal. Tandem gait is normal. Romberg is negative. No drift is seen.  Reflexes: Deep tendon reflexes are symmetric and normal bilaterally. Toes are downgoing bilaterally.   CT head 11/13/13:  IMPRESSION: Mild atrophy. No acute  intracranial findings.  * CT scan images were reviewed online. I agree with the written report.    Assessment/Plan:  1.  Common migraine headache, converted migraine  The patient indicates that she has had a very severe headache that has been unlike any headaches that she has had previously.  She now has daily headaches which are unusual for her.  The patient will be sent for blood work today, MRI of the brain will be done.  The patient will be placed on amitriptyline taking 75 mg at night, she will be placed on Topamax working up to 75 mg at night.  The patient can continue the Imitrex and Excedrin Migraine, but she was cautioned not to use Imitrex daily.  The patient will follow-up in 3 months.  She will call for any dose adjustments.   Jill Alexanders MD 12/24/2017 8:26 AM  [facility] [address] [place], [place] [address]  Phone [phone] Fax [phone]

## 2017-12-25 ENCOUNTER — Telehealth: Payer: Self-pay

## 2017-12-25 LAB — C-REACTIVE PROTEIN: CRP: <1 mg/L (ref 0–10)

## 2017-12-25 LAB — SEDIMENTATION RATE: Sed Rate: 48 mm/hr — ABNORMAL HIGH (ref 0–40)

## 2017-12-25 NOTE — Telephone Encounter (Signed)
I called pt. I advised her of her lab results. Pt verbalized understanding of results. Pt had no questions at this time but was encouraged to call back if questions arise.

## 2017-12-25 NOTE — Telephone Encounter (Signed)
-----  Message from Charles K Willis, MD sent at 12/25/2017  7:28 AM EDT ----- There is a mild elevation of the ESR, but the CRP is well within normal range. We will follow over time. Please call the patient. Unlikely that there is a significant inflammatory process present. ----- Message ----- From: Interface, [facility] Lab Results In Sent: 12/25/2017   5:40 AM EDT To: Charles K Willis, MD   

## 2017-12-25 NOTE — Telephone Encounter (Signed)
I called pt to discuss her lab work, no answer, left a message asking her to call me back. 

## 2018-01-09 ENCOUNTER — Ambulatory Visit: Payer: Medicare HMO

## 2018-01-22 ENCOUNTER — Ambulatory Visit
Admission: RE | Admit: 2018-01-22 | Discharge: 2018-01-22 | Disposition: A | Discharge status: Home / Self Care | Payer: 59 | Source: Ambulatory Visit | Attending: Neurology | Admitting: Neurology

## 2018-01-22 DIAGNOSIS — G4489 Other headache syndrome: Secondary | ICD-10-CM

## 2018-01-24 ENCOUNTER — Telehealth: Payer: Self-pay | Admitting: Neurology

## 2018-01-24 NOTE — Telephone Encounter (Signed)
I called the patient.  MRI of the brain is unremarkable, the patient does have some involving the left sphenoid sinus, not sure this is a full cause of all of her headaches which are more generalized in nature.  Patient will continue on with the medication, she will call for any dose adjustments.    MRI brain 01/22/18:  IMPRESSION: This MRI of the brain without contrast shows the following: 1.    The brain volume is normal for age.  There are minimal chronic microvascular ischemic changes that are typical for age. 2.    The left hemi-sphenoid sinus is opacified, possibly due to a mucocele, which could lead to headaches. 3.    There are no acute findings.

## 2018-03-23 NOTE — Progress Notes (Signed)
[facility]  PATIENT: Pamela Dalton DOB: 1950/06/18   REASON FOR VISIT: Follow-up for headache syndrome HISTORY FROM: Patient    HISTORY OF PRESENT ILLNESS:UPDATE 12/18/2019CM Ms. Pamela Dalton, 67 year old female returns for follow-up with a history of headache syndrome.  When last seen by Dr. Jannifer Franklin she was having severe headaches frequently.  MRI of the brain was unremarkable.  She was placed on Topamax titrating to 75 mg at bedtime and Elavil titrating to 75 mg at bedtime.  Her headaches are improved tremendously and she is really pleased with her response.  ESR was mildly elevated when last seen today.  She has no new neurologic complaints.  She returns for reevaluation  9/19/19[name]. Dome is a 67 year old right-handed black female with a history of migraine headaches dating back to her early 67s.  The patient generally will have a headache once every 3 weeks or so, the headaches are usually not extremely severe and usually in the bitemporal regions associated with a throbbing sensation.  The patient may have photophobia and phonophobia, she usually does not get blurred vision, but she may have some nausea and vomiting.  Stress and spicy foods are sometimes triggers for the headache.  About a month ago, the patient had a slow build up of a severe headache.  The patient was having more frequent headaches 3 weeks prior to the onset of the more severe headache, and then eventually the headache has become daily since that time.  The headache was quite severe, unlike any headaches that she has had previously.  The headache includes the entire head, there was neck stiffness, blurred vision, severe nausea vomiting and severe photophobia and phonophobia.  The patient was given a trial on a prednisone Dosepak which did not help.  She was placed on amitriptyline, she currently takes 3.5 of the 25 mg tablets daily without benefit.  She has been given Imitrex nasal spray which does  seem to be helpful.  The patient may take Excedrin Migraine as well and she has Zofran for nausea.  The patient does have a history of diabetes, but her hemoglobin A1c runs around 6.  The patient is sent to this office for an evaluation.  She does have a family history of migraine headaches in the daughter.  The patient does report some numbness of the hands bilaterally, but no numbness in the feet.  She denies any weakness.  She indicates that she does drink ice tea sometimes, but does not take in a lot of caffeinated products during the day otherwise.  REVIEW OF SYSTEMS: Full 14 system review of systems performed and notable only for those listed, all others are neg:  Constitutional: neg  Cardiovascular: neg Ear/Nose/Throat: neg  Skin: neg Eyes: neg Respiratory: neg Gastroitestinal: neg  Hematology/Lymphatic: Easy bruising Endocrine: neg Musculoskeletal: Joint pain Allergy/Immunology: neg Neurological: Migraine headaches Psychiatric: neg Sleep : neg   ALLERGIES: Allergies  Allergen Reactions  . Codeine     REACTION: Rash and itching    HOME MEDICATIONS: Outpatient Medications Prior to Visit  Medication Sig Dispense Refill  . amitriptyline (ELAVIL) 25 MG tablet Take 3 tablets (75 mg total) by mouth at bedtime. 270 tablet 1  . amLODipine (NORVASC) 5 MG tablet Take 5 mg by mouth daily.     [name] aspirin 81 MG tablet Take 81 mg by mouth daily.    . benazepril (LOTENSIN) 10 MG tablet Take 10 mg by mouth daily.     . cetirizine (ZYRTEC) 10 MG tablet Take  10 mg by mouth daily.    [name] esomeprazole (NEXIUM) 20 MG capsule Take 20 mg by mouth daily as needed (acid reflux).     . Fluticasone-Salmeterol (ADVAIR) 100-50 MCG/DOSE AEPB Inhale 1 puff into the lungs every 12 (twelve) hours.    . folic acid (FOLVITE) 1 MG tablet Take 1 mg by mouth daily.    . hydrochlorothiazide (HYDRODIURIL) 25 MG tablet Take 25 mg by mouth daily.     [name] LORazepam (ATIVAN) 1 MG tablet Take 1 mg by mouth every 8  (eight) hours.    [name] losartan-hydrochlorothiazide (HYZAAR) 100-25 MG per tablet Take 1 tablet by mouth daily.    . montelukast (SINGULAIR) 10 MG tablet Take 10 mg by mouth at bedtime.     . Olopatadine HCl (PATADAY) 0.2 % SOLN Place 1 drop into both eyes daily.    [name] oxybutynin (DITROPAN) 5 MG tablet Take 1 tablet (5 mg total) by mouth 3 (three) times daily. 90 tablet 3  . topiramate (TOPAMAX) 25 MG tablet Take one tablet at night for one week, then take 2 tablets at night for one week, then take 3 tablets at night. 90 tablet 3  . valACYclovir (VALTREX) 500 MG tablet Take 500 mg by mouth daily.    . cyclobenzaprine (FLEXERIL) 10 MG tablet Take 10 mg by mouth 3 (three) times daily as needed for muscle spasms.      No facility-administered medications prior to visit.     PAST MEDICAL HISTORY: Past Medical History:  Diagnosis Date  . Arthritis   . Asthma   . Back pain   . Common migraine with intractable migraine 12/24/2017  . DDD (degenerative disc disease)   . Diabetes mellitus without complication ([place])   . Full dentures   . GERD (gastroesophageal reflux disease)   . Hypertension   . Leg pain   . Nerve pain   . Wears glasses     PAST SURGICAL HISTORY: Past Surgical History:  Procedure Laterality Date  . ABDOMINAL HYSTERECTOMY  1981   one ovary/ tube removed  . BACK SURGERY  2005   L4, L5 fused  . Back surgery, 12/27/2012 L3 replaced cushion, L4/L5 fused  12/27/2012  . COLONOSCOPY    . SHOULDER ARTHROSCOPY WITH ROTATOR CUFF REPAIR AND SUBACROMIAL DECOMPRESSION Right 10/31/2013   Procedure: Right shoulder arthroscopy rotator cuff debridement, subacromial decompression, distal clavical excision;  Surgeon: Nita Sells, MD;  Location: [facility];  Service: Orthopedics;  Laterality: Right;  Right shoulder arthroscopy rotator cuff debridement, subacromial decompression, distal clavical excision  . Thumb surgery- joint removed  2009   Arthritis-rt  . THYROID  CYST EXCISION  2009  . TONSILLECTOMY      FAMILY HISTORY: Family History  Problem Relation Age of Onset  . Alzheimer's disease Father   . Parkinsonism Brother   . Migraines Daughter   . Cancer - Prostate Brother   . Breast cancer Neg Hx     SOCIAL HISTORY: Social History   Socioeconomic History  . Marital status: Divorced    Spouse name: Not on file  . Number of children: Not on file  . Years of education: Not on file  . Highest education level: Not on file  Occupational History  . Not on file  Social Needs  . Financial resource strain: Not on file  . Food insecurity:    Worry: Not on file    Inability: Not on file  . Transportation needs:    Medical: Not  on file    Non-medical: Not on file  Tobacco Use  . Smoking status: Former Smoker    Last attempt to quit: 10/27/2003    Years since quitting: 14.4  . Smokeless tobacco: Never Used  . Tobacco comment: Quit smoking 2007  Substance and Sexual Activity  . Alcohol use: No    Comment: stopped drinking 2009  . Drug use: No    Comment: past history marijauna 2013  . Sexual activity: Never    Birth control/protection: Surgical  Lifestyle  . Physical activity:    Days per week: Not on file    Minutes per session: Not on file  . Stress: Not on file  Relationships  . Social connections:    Talks on phone: Not on file    Gets together: Not on file    Attends religious service: Not on file    Active member of club or organization: Not on file    Attends meetings of clubs or organizations: Not on file    Relationship status: Not on file  . Intimate partner violence:    Fear of current or ex partner: Not on file    Emotionally abused: Not on file    Physically abused: Not on file    Forced sexual activity: Not on file  Other Topics Concern  . Not on file  Social History Narrative  . Not on file     PHYSICAL EXAM  Vitals:   03/24/18 1101  BP: 119/82  Pulse: 87  Weight: 232 lb (105.2 kg)  Height: '5\' 2"'   (1.575 m)   Body mass index is 42.43 kg/m.  Generalized: Well developed, in no acute distress  Head: normocephalic and atraumatic,. Oropharynx benign  Neck: Supple,  Musculoskeletal: No deformity   Neurological examination   Mentation: Alert oriented to time, place, history taking. Attention span and concentration appropriate. Recent and remote memory intact.  Follows all commands speech and language fluent.   Cranial nerve II-XII: Pupils were equal round reactive to light extraocular movements were full, visual field were full on confrontational test. Facial sensation and strength were normal. hearing was intact to finger rubbing bilaterally. Uvula tongue midline. head turning and shoulder shrug were normal and symmetric.Tongue protrusion into cheek strength was normal. Motor: normal bulk and tone, full strength in the BUE, BLE,  Sensory: normal and symmetric to light touch,  Coordination: finger-nose-finger, heel-to-shin bilaterally, no dysmetria Reflexes: Symmetric upper and lower, plantar responses were flexor bilaterally. Gait and Station: Rising up from seated position without assistance, normal stance,  moderate stride, good arm swing, smooth turning, able to perform tiptoe, and heel walking without difficulty. Tandem gait is steady  DIAGNOSTIC DATA (LABS, IMAGING, TESTING) - I reviewed patient records, labs, notes, testing and imaging myself where available.  Lab Results  Component Value Date   WBC 6.8 11/13/2013   HGB 11.4 (L) 11/13/2013   HCT 35.9 (L) 11/13/2013   MCV 85.3 11/13/2013   PLT 278 11/13/2013      Component Value Date/Time   NA 141 11/13/2013 1231   K 4.1 11/13/2013 1231   CL 104 11/13/2013 1231   CO2 24 11/13/2013 1231   GLUCOSE 113 (H) 11/13/2013 1231   BUN 17 11/13/2013 1231   CREATININE 0.82 11/13/2013 1231   CALCIUM 10.8 (H) 11/13/2013 1231   CALCIUM 10.4 03/16/2010 0057   PROT 7.3 02/21/2010 2259   ALBUMIN 4.4 02/21/2010 2259   AST 16  02/21/2010 2259   ALT 12 02/21/2010 2259  ALKPHOS 60 02/21/2010 2259   BILITOT 0.4 02/21/2010 2259   GFRNONAA 75 (L) 11/13/2013 1231   GFRAA 87 (L) 11/13/2013 1231   Lab Results  Component Value Date   CHOL 152 02/21/2010   HDL 58 02/21/2010   LDLCALC 80 02/21/2010   TRIG 68 02/21/2010   CHOLHDL 2.6 Ratio 02/21/2010   Lab Results  Component Value Date   HGBA1C 5.5 05/07/2010   No results found for: VITAMINB12 Lab Results  Component Value Date   TSH 0.613 08/16/2007     ASSESSMENT AND PLAN  67 y.o. year old female  has a past medical history of  DDD (degenerative disc disease), Diabetes mellitus without complication ([place]), here to follow-up for common migraine headache.  MRI of the brain was unremarkable in September 2019     PLAN: Continue amitriptyline 75 mg at night migraine preventive Continue Topamax 75 mg at night migraine preventive MRI of the brain was unremarkable  We will repeat ESR which was mildly elevated at her last visit. Follow up 6 months and if stable yearly Dennie Bible, [facility], [facility], APRN  [facility] [address] [place], [place] [address] [phone]

## 2018-03-24 ENCOUNTER — Encounter: Payer: Self-pay | Admitting: Nurse Practitioner

## 2018-03-24 ENCOUNTER — Ambulatory Visit (INDEPENDENT_AMBULATORY_CARE_PROVIDER_SITE_OTHER): Payer: Medicare HMO | Admitting: Nurse Practitioner

## 2018-03-24 VITALS — BP 119/82 | HR 87 | Ht 62.0 in | Wt 232.0 lb

## 2018-03-24 DIAGNOSIS — G43009 Migraine without aura, not intractable, without status migrainosus: Secondary | ICD-10-CM | POA: Diagnosis not present

## 2018-03-24 NOTE — Patient Instructions (Signed)
Continue amitriptyline 75 mg at night migraine preventive Continue Topamax 75 mg at night migraine preventive MRI of the brain was unremarkable  We will repeat ESR which was mildly elevated at her last visit. Follow up 6 months and if stable yearly

## 2018-03-24 NOTE — Progress Notes (Signed)
I have read the note, and I agree with the clinical assessment and plan.  Charles K Willis   

## 2018-03-25 ENCOUNTER — Ambulatory Visit: Payer: Medicare HMO | Admitting: Nurse Practitioner

## 2018-03-25 LAB — C-REACTIVE PROTEIN: CRP: <1 mg/L (ref 0–10)

## 2018-03-25 LAB — SEDIMENTATION RATE: Sed Rate: 59 mm/hr — ABNORMAL HIGH (ref 0–40)

## 2018-03-26 ENCOUNTER — Telehealth: Payer: Self-pay | Admitting: *Deleted

## 2018-03-26 NOTE — Telephone Encounter (Signed)
Spoke with daughter, Otila Kluver on [name] and informed her that the patient's labs showed a mildly elevated sed rate, otherwise normal. Advised her the NP discussed with Dr Jannifer Franklin who stated no change in her treatment. The daughter verbalized understanding, appreciation.

## 2018-04-18 ENCOUNTER — Other Ambulatory Visit: Payer: Self-pay | Admitting: Neurology

## 2018-06-17 ENCOUNTER — Other Ambulatory Visit: Payer: Self-pay | Admitting: Neurology

## 2018-07-31 ENCOUNTER — Emergency Department (HOSPITAL_COMMUNITY): Payer: Medicare HMO

## 2018-07-31 ENCOUNTER — Other Ambulatory Visit: Payer: Self-pay

## 2018-07-31 ENCOUNTER — Encounter (HOSPITAL_COMMUNITY): Payer: Self-pay | Admitting: Emergency Medicine

## 2018-07-31 ENCOUNTER — Emergency Department (HOSPITAL_COMMUNITY)
Admission: EM | Admit: 2018-07-31 | Discharge: 2018-08-01 | Disposition: A | Discharge status: Home / Self Care | Payer: Medicare HMO | Attending: Emergency Medicine | Admitting: Emergency Medicine

## 2018-07-31 DIAGNOSIS — Y939 Activity, unspecified: Secondary | ICD-10-CM | POA: Insufficient documentation

## 2018-07-31 DIAGNOSIS — Y999 Unspecified external cause status: Secondary | ICD-10-CM | POA: Diagnosis not present

## 2018-07-31 DIAGNOSIS — W01190A Fall on same level from slipping, tripping and stumbling with subsequent striking against furniture, initial encounter: Secondary | ICD-10-CM | POA: Insufficient documentation

## 2018-07-31 DIAGNOSIS — S52522A Torus fracture of lower end of left radius, initial encounter for closed fracture: Secondary | ICD-10-CM | POA: Insufficient documentation

## 2018-07-31 DIAGNOSIS — Z87891 Personal history of nicotine dependence: Secondary | ICD-10-CM | POA: Diagnosis not present

## 2018-07-31 DIAGNOSIS — S52622A Torus fracture of lower end of left ulna, initial encounter for closed fracture: Secondary | ICD-10-CM | POA: Insufficient documentation

## 2018-07-31 DIAGNOSIS — Z79899 Other long term (current) drug therapy: Secondary | ICD-10-CM | POA: Insufficient documentation

## 2018-07-31 DIAGNOSIS — S52502A Unspecified fracture of the lower end of left radius, initial encounter for closed fracture: Secondary | ICD-10-CM

## 2018-07-31 DIAGNOSIS — Z7982 Long term (current) use of aspirin: Secondary | ICD-10-CM | POA: Insufficient documentation

## 2018-07-31 DIAGNOSIS — Y92009 Unspecified place in unspecified non-institutional (private) residence as the place of occurrence of the external cause: Secondary | ICD-10-CM | POA: Insufficient documentation

## 2018-07-31 DIAGNOSIS — S6992XA Unspecified injury of left wrist, hand and finger(s), initial encounter: Secondary | ICD-10-CM | POA: Diagnosis present

## 2018-07-31 DIAGNOSIS — E119 Type 2 diabetes mellitus without complications: Secondary | ICD-10-CM | POA: Insufficient documentation

## 2018-07-31 DIAGNOSIS — I10 Essential (primary) hypertension: Secondary | ICD-10-CM | POA: Insufficient documentation

## 2018-07-31 DIAGNOSIS — S62102S Fracture of unspecified carpal bone, left wrist, sequela: Secondary | ICD-10-CM

## 2018-07-31 MED ORDER — HYDROMORPHONE HCL 1 MG/ML IJ SOLN
1.0000 mg | Freq: Once | INTRAMUSCULAR | Status: AC
  Administered 2018-08-01: 1 mg via INTRAVENOUS
  Filled 2018-07-31: qty 1

## 2018-07-31 MED ORDER — PROPOFOL 10 MG/ML IV BOLUS
0.5000 mg/kg | Freq: Once | INTRAVENOUS | Status: DC
  Filled 2018-07-31: qty 20

## 2018-07-31 NOTE — ED Triage Notes (Signed)
Pt presents to ED by EMS from home. Pt slipped on a child's toy and fell. Hit arm on chair. Per EMS clear wrist deformity. Pain 9/10. No LOC, did not hit head.

## 2018-07-31 NOTE — ED Provider Notes (Signed)
[facility] EMERGENCY DEPARTMENT Provider Note   CSN: [id number] Arrival date & time: 07/31/18  2240    History   Chief Complaint Chief Complaint  Patient presents with   Wrist Pain    HPI Keyah A Crego is a 68 y.o. female.     Patient presents with left wrist pain after slip and fall at home.  States she slipped over a child's toy and fell onto an outstretched left arm against a rocking chair.  Denies hitting her head or losing consciousness.  Complains of pain to her left wrist and forearm.  Denies any head, neck, back, chest or abdominal pain.  She complains of pain to her left wrist with reduced range of motion.  No numbness or tingling.  No elbow or shoulder pain.  Denies any blood thinner use.  The history is provided by the patient and the EMS personnel.  Wrist Pain  Pertinent negatives include no chest pain, no headaches and no shortness of breath.    Past Medical History:  Diagnosis Date   Arthritis    Asthma    Back pain    Common migraine with intractable migraine 12/24/2017   DDD (degenerative disc disease)    Diabetes mellitus without complication ([place])    Full dentures    GERD (gastroesophageal reflux disease)    Hypertension    Leg pain    Nerve pain    Wears glasses     Patient Active Problem List   Diagnosis Date Noted   Common migraine 03/24/2018   Common migraine with intractable migraine 12/24/2017   SINUSITIS, ACUTE 05/07/2010   UTI 12/27/2009   VAGINITIS, BACTERIAL 12/07/2009   URINALYSIS, ABNORMAL 12/07/2009   URI 05/09/2009   COLONIC POLYPS 02/01/2009   HEMORRHOIDS, INTERNAL 02/01/2009   DIVERTICULOSIS OF COLON 02/01/2009   ALLERGIC ASTHMA 01/02/2009   PELVIC PAIN, LEFT 01/02/2009   ONYCHOMYCOSIS 08/01/2008   LEG CRAMPS 08/01/2008   DIABETES MELLITUS, TYPE II, CONTROLLED 10/07/2007   PLANTAR FASCIITIS, BILATERAL 10/07/2007   PERIPHERAL EDEMA 09/21/2007   OBESITY 08/16/2007    SINUSITIS 08/16/2007   PERIPHERAL NEUROPATHY 07/27/2007   ALLERGIC RHINITIS 07/27/2007   GERD 07/27/2007   CARPAL TUNNEL SYNDROME, RIGHT 05/27/2007   SHOULDER PAIN, RIGHT 05/27/2007   HYPERPARATHYROIDISM, PRIMARY 01/10/2007   BRONCHITIS, ACUTE 12/24/2006   DYSLIPIDEMIA 11/22/2006   DISORDER, 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   DISORDER, TOBACCO USE 11/22/2006   HYPERTENSION, BENIGN ESSENTIAL 11/22/2006   URINARY INCONTINENCE, 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   PROTEINURIA 11/22/2006   HYPERCALCEMIA 10/06/2006    Past Surgical History:  Procedure Laterality Date   ABDOMINAL HYSTERECTOMY  1981   one ovary/ tube removed   BACK SURGERY  2005   L4, L5 fused   Back surgery, 12/27/2012 L3 replaced cushion, L4/L5 fused  12/27/2012   COLONOSCOPY     SHOULDER ARTHROSCOPY WITH ROTATOR CUFF REPAIR AND SUBACROMIAL DECOMPRESSION Right 10/31/2013   Procedure: Right shoulder arthroscopy rotator cuff debridement, subacromial decompression, distal clavical excision;  Surgeon: Nita Sells, MD;  Location: [facility];  Service: Orthopedics;  Laterality: Right;  Right shoulder arthroscopy rotator cuff debridement, subacromial decompression, distal clavical excision   Thumb surgery- joint removed  2009   Arthritis-rt   THYROID CYST EXCISION  2009   TONSILLECTOMY       OB History   No obstetric history on file.      Home Medications    Prior to Admission medications   Medication Sig Start Date End Date Taking?  Authorizing Provider  amitriptyline (ELAVIL) 25 MG tablet TAKE 3 TABLETS(75 MG) BY MOUTH AT BEDTIME 06/17/18   Kathrynn Ducking, MD  amLODipine (NORVASC) 5 MG tablet Take 5 mg by mouth daily.  11/02/12   [provider]  aspirin 81 MG tablet Take 81 mg by mouth daily.    [provider]  benazepril (LOTENSIN) 10 MG tablet Take 10 mg by mouth daily.  11/02/12   [provider]  cetirizine (ZYRTEC) 10 MG tablet Take 10 mg by mouth daily.     [provider]  cyclobenzaprine (FLEXERIL) 10 MG tablet Take 10 mg by mouth 3 (three) times daily as needed for muscle spasms.  10/16/12   [provider]  esomeprazole (NEXIUM) 20 MG capsule Take 20 mg by mouth daily as needed (acid reflux).     [provider]  Fluticasone-Salmeterol (ADVAIR) 100-50 MCG/DOSE AEPB Inhale 1 puff into the lungs every 12 (twelve) hours.    [provider]  folic acid (FOLVITE) 1 MG tablet Take 1 mg by mouth daily.    [provider]  hydrochlorothiazide (HYDRODIURIL) 25 MG tablet Take 25 mg by mouth daily.  11/02/12   [provider]  LORazepam (ATIVAN) 1 MG tablet Take 1 mg by mouth every 8 (eight) hours.    [provider]  losartan-hydrochlorothiazide (HYZAAR) 100-25 MG per tablet Take 1 tablet by mouth daily.    [provider]  montelukast (SINGULAIR) 10 MG tablet Take 10 mg by mouth at bedtime.  11/02/12   [provider]  Olopatadine HCl (PATADAY) 0.2 % SOLN Place 1 drop into both eyes daily.    [provider]  oxybutynin (DITROPAN) 5 MG tablet Take 1 tablet (5 mg total) by mouth 3 (three) times daily. 01/25/14   Lavonia Drafts, MD  topiramate (TOPAMAX) 25 MG tablet 3 TABLETS AT NIGHT 04/19/18   Dennie Bible, NP  valACYclovir (VALTREX) 500 MG tablet Take 500 mg by mouth daily.    [provider]    Family History Family History  Problem Relation Age of Onset   Alzheimer's disease Father    Parkinsonism Brother    Migraines Daughter    Cancer - Prostate Brother    Breast cancer Neg Hx     Social History Social History   Tobacco Use   Smoking status: Former Smoker    Last attempt to quit: 10/27/2003    Years since quitting: 14.7   Smokeless tobacco: Never Used   Tobacco comment: Quit smoking 2007  Substance Use Topics   Alcohol use: No    Comment: stopped drinking 2009   Drug use: No    Comment: past history marijauna  2013     Allergies   Codeine and Fentanyl   Review of Systems Review of Systems  Constitutional: Negative for activity change, appetite change and fever.  HENT: Negative for congestion and rhinorrhea.   Eyes: Negative for visual disturbance.  Respiratory: Negative for cough, chest tightness and shortness of breath.   Cardiovascular: Negative for chest pain.  Gastrointestinal: Negative for nausea and vomiting.  Genitourinary: Negative for dysuria and hematuria.  Musculoskeletal: Positive for arthralgias and myalgias.  Skin: Negative for rash and wound.  Neurological: Negative for dizziness, weakness and headaches.    all other systems are negative except as noted in the HPI and PMH.    Physical Exam Updated Vital Signs BP 126/85 (BP Location: Right Arm)    Temp 98.8 F (37.1 C) (Oral)  Resp 16    Ht 5\' 2"  (1.575 m)    Wt 111.1 kg    SpO2 96%    BMI 44.81 kg/m   Physical Exam Vitals signs and nursing note reviewed.  Constitutional:      General: She is not in acute distress.    Appearance: She is well-developed. She is obese.  HENT:     Head: Normocephalic and atraumatic.     Mouth/Throat:     Pharynx: No oropharyngeal exudate.  Eyes:     Conjunctiva/sclera: Conjunctivae normal.     Pupils: Pupils are equal, round, and reactive to light.  Neck:     Musculoskeletal: Normal range of motion.     Comments: No C-spine tenderness Cardiovascular:     Rate and Rhythm: Normal rate and regular rhythm.     Heart sounds: Normal heart sounds. No murmur.  Pulmonary:     Effort: Pulmonary effort is normal. No respiratory distress.     Breath sounds: Normal breath sounds.  Abdominal:     Palpations: Abdomen is soft.     Tenderness: There is no abdominal tenderness. There is no guarding or rebound.  Musculoskeletal: Normal range of motion.        General: Tenderness and deformity present.     Comments: Left wrist deformity without any open wounds.  Able to wiggle fingers and  extend thumb.  Palpable radial pulse. Full range of motion of left shoulder and elbow without pain  Skin:    General: Skin is warm.     Capillary Refill: Capillary refill takes less than 2 seconds.  Neurological:     General: No focal deficit present.     Mental Status: She is alert and oriented to person, place, and time. Mental status is at baseline.     Cranial Nerves: No cranial nerve deficit.     Motor: No abnormal muscle tone.     Coordination: Coordination normal.     Comments:  5/5 strength throughout. CN 2-12 intact.Equal grip strength.   Psychiatric:        Behavior: Behavior normal.      ED Treatments / Results  Labs (all labs ordered are listed, but only abnormal results are displayed) Labs Reviewed - No data to display  EKG None  Radiology Dg Forearm Left  Result Date: 07/31/2018 CLINICAL DATA:  Fall, left wrist pain EXAM: LEFT FOREARM - 2 VIEW COMPARISON:  Wrist series performed today FINDINGS: There are comminuted fractures noted through the distal left radius and ulna with impaction and displacement. No subluxation or dislocation. Advanced arthritic changes in the left wrist. IMPRESSION: Comminuted, displaced and impacted distal left radial and ulnar fractures. Electronically Signed   By: Rolm Baptise M.D.   On: 07/31/2018 23:25   Dg Wrist Complete Left  Result Date: 08/01/2018 CLINICAL DATA:  Postreduction EXAM: LEFT WRIST - COMPLETE 3+ VIEW COMPARISON:  07/31/2018 FINDINGS: In cast views of the left wrist demonstrates comminuted, displaced and impacted distal left radial fracture. Fracture fragments remain moderately displaced. Distal ulnar fracture, nondisplaced. IMPRESSION: Continued displacement of fracture fragments from the comminuted, impacted distal left radial fracture. Nondisplaced ulnar styloid fracture. Electronically Signed   By: Rolm Baptise M.D.   On: 08/01/2018 02:37   Dg Wrist Complete Left  Result Date: 07/31/2018 CLINICAL DATA:  Fall, left  wrist pain EXAM: LEFT WRIST - COMPLETE 3+ VIEW COMPARISON:  None. FINDINGS: Comminuted fractures noted in the distal left radius and ulna which are impacted and displaced. Advanced arthritic  changes within the left wrist, particularly 1st carpometacarpal joint. No subluxation or dislocation. IMPRESSION: Comminuted, impacted and displaced distal left radial and ulnar fractures. Electronically Signed   By: Rolm Baptise M.D.   On: 07/31/2018 23:25   Ct Wrist Left Wo Contrast  Result Date: 08/01/2018 CLINICAL DATA:  Wrist fracture EXAM: CT OF THE LEFT WRIST WITHOUT CONTRAST TECHNIQUE: Multidetector CT imaging was performed according to the standard protocol. Multiplanar CT image reconstructions were also generated. COMPARISON:  Plain films earlier today FINDINGS: There is a comminuted fracture through the distal left radius. There is impaction and displacement of fracture fragments. A nondisplaced component extends proximally into the distal radial shaft. Mildly comminuted nondisplaced fracture through the distal ulna. Advanced arthritis in the right wrist with joint space loss in the radiocarpal joint. There is chondrocalcinosis. Advanced osteoarthritis at the 1st carpometacarpal joint. IMPRESSION: Comminuted, impacted distal right radial fracture with a nondisplaced component that extends proximally into the distal radial shaft. Nondisplaced distal left ulnar fracture. Advanced arthritis in the left wrist. Electronically Signed   By: Rolm Baptise M.D.   On: 08/01/2018 01:37    Procedures Reduction of fracture Date/Time: 08/01/2018 2:58 AM Performed by: Ezequiel Essex, MD Authorized by: Ezequiel Essex, MD  Consent: Verbal consent obtained. Risks and benefits: risks, benefits and alternatives were discussed Consent given by: patient Patient understanding: patient states understanding of the procedure being performed Patient consent: the patient's understanding of the procedure matches consent  given Procedure consent: procedure consent matches procedure scheduled Relevant documents: relevant documents present and verified Test results: test results available and properly labeled Site marked: the operative site was marked Imaging studies: imaging studies available Patient identity confirmed: verbally with patient and hospital-assigned identification number Time out: Immediately prior to procedure a "time out" was called to verify the correct patient, procedure, equipment, support staff and site/side marked as required. Local anesthesia used: no  Anesthesia: Local anesthesia used: no  Sedation: Patient sedated: no  Patient tolerance: Patient tolerated the procedure well with no immediate complications    (including critical care time)  Medications Ordered in ED Medications - No data to display   Initial Impression / Assessment and Plan / ED Course  I have reviewed the triage vital signs and the nursing notes.  Pertinent labs & imaging results that were available during my care of the patient were reviewed by me and considered in my medical decision making (see chart for details).       Mechanical fall with left wrist injury.  No loss of consciousness.  Neurovascularly intact with suspected fracture. No head injury.  Patient declines pain medication.  X-ray confirms impacted and displaced distal radius and ulna fractures. Will discuss with hand surgery.  Discussed with Dr. Grandville Silos of hand surgery who reviewed images.  He feels patient does not have any significant displacement but just impaction.  Does not feel she needs to be sedated.  Recommends attempted axial reduction with finger traps and splinting in that position.  Also recommend CT scan which he ordered. Dr. Grandville Silos does not feel that patient will need any formal reduction as axial traction should improve her impaction. She does not have much dorsal or volar displacement. She may or may not need surgery.   His office will call her on Monday.  Patient splinted after application of axial traction by finger traps as above.   Her pain is well controlled.  She has good capillary refill range of motion of her fingers.  Discussed ice, elevation,  follow-up with Dr. Grandville Silos.  He will call on Monday.  Return precautions discussed including worsening pain, numbness, tingling, temperature or sensory change or any other concerns.  Final Clinical Impressions(s) / ED Diagnoses   Final diagnoses:  Wrist fracture, closed, left, sequela  Closed fracture of distal end of left radius, unspecified fracture morphology, initial encounter    ED Discharge Orders    None       Jaquise Faux, Annie Main, MD 08/01/18 0301

## 2018-08-01 ENCOUNTER — Emergency Department (HOSPITAL_COMMUNITY): Payer: Medicare HMO

## 2018-08-01 MED ORDER — HYDROCODONE-ACETAMINOPHEN 5-325 MG PO TABS: 1.0000 | ORAL_TABLET | ORAL | 0 refills | Status: DC | PRN

## 2018-08-01 MED ORDER — HYDROMORPHONE HCL 1 MG/ML IJ SOLN
1.0000 mg | Freq: Once | INTRAMUSCULAR | Status: AC
  Administered 2018-08-01: 1 mg via INTRAVENOUS
  Filled 2018-08-01: qty 1

## 2018-08-01 NOTE — Discharge Instructions (Addendum)
Follow up with Dr. Grandville Silos on Tuesday. His office will call you on Monday. Keep arm elevated and use ice. Return to the ED with worsening pain, weakness, numbness, tingling, color or temperature change or any other concerns.  Discharge Instructions   You have a splint on your broken arm.  It is to stay on until your office visit on Tuesday. Continue to slowley move your fingers, making a full fist and fully opening the fist. Elevate your hand to reduce pain & swelling of the digits.  Ice over the may be helpful to reduce pain & swelling.  DO NOT USE HEAT. You may shower, but keep the bandage clean & dry.  You may drive a car when you are off of prescription pain medications and can safely control your vehicle with both hands. Our office will call you Monday to arrange follow-up for Tuesday   Please call [phone] during normal business hours or [phone] after hours for any problems. Including the following:  - excessive redness of the incisions - drainage for more than 4 days - fever of more than 101.5 F  *Please note that pain medications will not be refilled after hours or on weekends.

## 2018-08-02 ENCOUNTER — Encounter (HOSPITAL_BASED_OUTPATIENT_CLINIC_OR_DEPARTMENT_OTHER): Payer: Self-pay | Admitting: *Deleted

## 2018-08-02 ENCOUNTER — Other Ambulatory Visit: Payer: Self-pay

## 2018-08-02 ENCOUNTER — Other Ambulatory Visit: Payer: Self-pay | Admitting: Orthopedic Surgery

## 2018-08-03 NOTE — H&P (Signed)
Pamela Dalton is an 68 y.o. female.   CC / Reason for Visit: Left wrist injury HPI: This patient is a 68 year old RHD retired female who presents for evaluation of left wrist injury that occurred when Pamela Dalton slipped on a child's toy and fell, sustaining this mechanical fall on Saturday.  Pamela Dalton was evaluated in emergency department placed into a sugar tong splint.  CT scan was obtained, revealing a very juxta articular distal radius fracture, significant degree of wrist arthritis, and a nondisplaced fracture through the very distal aspect of the ulna.  Pamela Dalton reports that prior to injury, Pamela Dalton did experience numbness and tingling in the thumb, index, long, and ring fingers for a long time off and on, having been diagnosed with carpal tunnel syndrome but without having had operative intervention.  Pamela Dalton has had increased numbness and tingling since her injury.  Pamela Dalton is using Norco.  Pamela Dalton denies any preexistent wrist pain.  Past Medical History:  Diagnosis Date  . Anxiety   . Arthritis   . Asthma    Advair as needed  . Back pain   . Common migraine with intractable migraine 12/24/2017  . DDD (degenerative disc disease)   . Full dentures   . GERD (gastroesophageal reflux disease)   . Hypertension   . Left wrist fracture   . Leg pain   . Nerve pain   . Wears glasses     Past Surgical History:  Procedure Laterality Date  . ABDOMINAL HYSTERECTOMY  1981   one ovary/ tube removed  . BACK SURGERY  2005   L4, L5 fused  . Back surgery, 12/27/2012 L3 replaced cushion, L4/L5 fused  12/27/2012  . COLONOSCOPY    . SHOULDER ARTHROSCOPY WITH ROTATOR CUFF REPAIR AND SUBACROMIAL DECOMPRESSION Right 10/31/2013   Procedure: Right shoulder arthroscopy rotator cuff debridement, subacromial decompression, distal clavical excision;  Surgeon: Nita Sells, MD;  Location: [facility];  Service: Orthopedics;  Laterality: Right;  Right shoulder arthroscopy rotator cuff debridement, subacromial  decompression, distal clavical excision  . Thumb surgery- joint removed  2009   Arthritis-rt  . THYROID CYST EXCISION  2009  . TONSILLECTOMY      Family History  Problem Relation Age of Onset  . Alzheimer's disease Father   . Parkinsonism Brother   . Migraines Daughter   . Cancer - Prostate Brother   . Breast cancer Neg Hx    Social History:  reports that Pamela Dalton quit smoking about 14 years ago. Pamela Dalton has never used smokeless tobacco. Pamela Dalton reports that Pamela Dalton does not drink alcohol or use drugs.  Allergies:  Allergies  Allergen Reactions  . Codeine     REACTION: Rash and itching  . Fentanyl Nausea And Vomiting    Had hard time waking up    No medications prior to admission.    No results found for this or any previous visit (from the past 48 hour(s)). No results found.  Review of Systems  All other systems reviewed and are negative.   Height 5\' 2"  (1.575 m), weight 111.1 kg. Physical Exam  Constitutional:  WD, WN, NAD HEENT:  NCAT, EOMI Neuro/Psych:  Alert & oriented to person, place, and time; appropriate mood & affect Lymphatic: No generalized UE edema or lymphadenopathy Extremities / MSK:  Both UE are normal with respect to appearance, ranges of motion, joint stability, muscle strength/tone, sensation, & perfusion except as otherwise noted:  Left hand in a sugar tong splint.  Digits are little puffy and swollen.  They are warm.  Intact light touch sensibility in the radial, median, and ulnar nerve distributions with intact motor to the same, but with altered sensibility in all digits but the small.  Labs / Xrays:  No radiographic studies obtained today. Injury x-rays and CT scan reviewed.  Assessment: Comminuted displaced intra-articular left distal radius fracture, very juxta articular, with relatively nondisplaced fracture of the very distal aspect of the ulna, with preexistent carpal tunnel syndrome, worsened with the present injury  Plan:  I discussed these findings  with her.  I reviewed the indications for surgical treatment.  I recommended proceeding also with open carpal tunnel release given her preexistent symptoms that are presently worsened.  We discussed volar plating, and also the possibility of dorsal bridge plating, depending upon how primary fixation proceed.  In addition, Pamela Dalton denied any wrist pain prior to injury, despite the radiographic arthritis, such that primary fusion is probably not the best option at this point.  We will plan to proceed tomorrow at [facility].  I discussed with her preoperative regional block as well.  The details of the operative procedure were discussed with the patient.  Questions were invited and answered.  In addition to the goal of the procedure, the risks of the procedure to include but not limited to bleeding; infection; damage to the nerves or blood vessels that could result in bleeding, numbness, weakness, chronic pain, and the need for additional procedures; stiffness; the need for revision surgery; and anesthetic risks were reviewed.  No specific outcome was guaranteed or implied.  Informed consent was obtained.  Prescription for oxycodone was sent to her pharmacy today, and I discussed taking ibuprofen and Tylenol at regular doses, filling in as needed with the narcotic.  Jolyn Nap, MD 08/03/2018, 10:21 AM

## 2018-08-04 ENCOUNTER — Encounter (HOSPITAL_BASED_OUTPATIENT_CLINIC_OR_DEPARTMENT_OTHER): Payer: Self-pay | Admitting: Certified Registered"

## 2018-08-04 ENCOUNTER — Ambulatory Visit (HOSPITAL_BASED_OUTPATIENT_CLINIC_OR_DEPARTMENT_OTHER)
Admission: RE | Admit: 2018-08-04 | Discharge: 2018-08-04 | Disposition: A | Discharge status: Home / Self Care | Payer: Medicare HMO | Attending: Orthopedic Surgery | Admitting: Orthopedic Surgery

## 2018-08-04 ENCOUNTER — Ambulatory Visit (HOSPITAL_BASED_OUTPATIENT_CLINIC_OR_DEPARTMENT_OTHER): Payer: Medicare HMO | Admitting: Anesthesiology

## 2018-08-04 ENCOUNTER — Encounter (HOSPITAL_BASED_OUTPATIENT_CLINIC_OR_DEPARTMENT_OTHER)
Admission: RE | Disposition: A | Discharge status: Home / Self Care | Payer: Self-pay | Source: Home / Self Care | Attending: Orthopedic Surgery

## 2018-08-04 ENCOUNTER — Ambulatory Visit (HOSPITAL_COMMUNITY): Payer: Medicare HMO

## 2018-08-04 ENCOUNTER — Other Ambulatory Visit: Payer: Self-pay

## 2018-08-04 DIAGNOSIS — Z419 Encounter for procedure for purposes other than remedying health state, unspecified: Secondary | ICD-10-CM

## 2018-08-04 DIAGNOSIS — K219 Gastro-esophageal reflux disease without esophagitis: Secondary | ICD-10-CM | POA: Insufficient documentation

## 2018-08-04 DIAGNOSIS — G5602 Carpal tunnel syndrome, left upper limb: Secondary | ICD-10-CM | POA: Diagnosis not present

## 2018-08-04 DIAGNOSIS — W010XXA Fall on same level from slipping, tripping and stumbling without subsequent striking against object, initial encounter: Secondary | ICD-10-CM | POA: Diagnosis not present

## 2018-08-04 DIAGNOSIS — Z79899 Other long term (current) drug therapy: Secondary | ICD-10-CM | POA: Diagnosis not present

## 2018-08-04 DIAGNOSIS — I10 Essential (primary) hypertension: Secondary | ICD-10-CM | POA: Diagnosis not present

## 2018-08-04 DIAGNOSIS — S52602A Unspecified fracture of lower end of left ulna, initial encounter for closed fracture: Secondary | ICD-10-CM | POA: Insufficient documentation

## 2018-08-04 DIAGNOSIS — Z7984 Long term (current) use of oral hypoglycemic drugs: Secondary | ICD-10-CM | POA: Insufficient documentation

## 2018-08-04 DIAGNOSIS — Z6841 Body Mass Index (BMI) 40.0 and over, adult: Secondary | ICD-10-CM | POA: Insufficient documentation

## 2018-08-04 DIAGNOSIS — S52572A Other intraarticular fracture of lower end of left radius, initial encounter for closed fracture: Secondary | ICD-10-CM | POA: Diagnosis present

## 2018-08-04 DIAGNOSIS — Z87891 Personal history of nicotine dependence: Secondary | ICD-10-CM | POA: Diagnosis not present

## 2018-08-04 HISTORY — DX: Anxiety disorder, unspecified: F41.9

## 2018-08-04 HISTORY — DX: Fracture of unspecified carpal bone, left wrist, initial encounter for closed fracture: S62.102A

## 2018-08-04 HISTORY — PX: OPEN REDUCTION INTERNAL FIXATION (ORIF) DISTAL RADIAL FRACTURE: SHX5989

## 2018-08-04 HISTORY — PX: CARPAL TUNNEL RELEASE: SHX101

## 2018-08-04 LAB — BASIC METABOLIC PANEL
Anion gap: 14 (ref 5–15)
BUN: 10 mg/dL (ref 8–23)
CO2: 23 mmol/L (ref 22–32)
Calcium: 10.7 mg/dL — ABNORMAL HIGH (ref 8.9–10.3)
Chloride: 103 mmol/L (ref 98–111)
Creatinine, Ser: 0.83 mg/dL (ref 0.44–1.00)
GFR calc Af Amer: >60 mL/min (ref >60)
GFR calc non Af Amer: >60 mL/min (ref >60)
Glucose, Bld: 164 mg/dL — ABNORMAL HIGH (ref 70–99)
Potassium: 3.5 mmol/L (ref 3.5–5.1)
Sodium: 140 mmol/L (ref 135–145)

## 2018-08-04 SURGERY — OPEN REDUCTION INTERNAL FIXATION (ORIF) DISTAL RADIUS FRACTURE
Anesthesia: General | Site: Wrist | Laterality: Left

## 2018-08-04 MED ORDER — MIDAZOLAM HCL 2 MG/2ML IJ SOLN
INTRAMUSCULAR | Status: AC
  Filled 2018-08-04: qty 2

## 2018-08-04 MED ORDER — EPHEDRINE SULFATE 50 MG/ML IJ SOLN
INTRAMUSCULAR | Status: DC | PRN
  Administered 2018-08-04 (×3): 10 mg via INTRAVENOUS

## 2018-08-04 MED ORDER — SODIUM CHLORIDE 0.9 % IV SOLN
INTRAVENOUS | Status: DC | PRN
  Administered 2018-08-04: 40 ug/min via INTRAVENOUS

## 2018-08-04 MED ORDER — PROPOFOL 500 MG/50ML IV EMUL
INTRAVENOUS | Status: DC | PRN
  Administered 2018-08-04: 25 ug/kg/min via INTRAVENOUS

## 2018-08-04 MED ORDER — BUPIVACAINE-EPINEPHRINE (PF) 0.5% -1:200000 IJ SOLN
INTRAMUSCULAR | Status: DC | PRN
  Administered 2018-08-04: 20 mL via PERINEURAL

## 2018-08-04 MED ORDER — PHENYLEPHRINE 40 MCG/ML (10ML) SYRINGE FOR IV PUSH (FOR BLOOD PRESSURE SUPPORT)
PREFILLED_SYRINGE | INTRAVENOUS | Status: AC
  Filled 2018-08-04: qty 20

## 2018-08-04 MED ORDER — SCOPOLAMINE 1 MG/3DAYS TD PT72: 1.0000 | MEDICATED_PATCH | Freq: Once | TRANSDERMAL | Status: DC | PRN

## 2018-08-04 MED ORDER — ONDANSETRON HCL 4 MG/2ML IJ SOLN: 4.0000 mg | Freq: Once | INTRAMUSCULAR | Status: DC | PRN

## 2018-08-04 MED ORDER — OXYCODONE HCL 5 MG PO TABS: 5.0000 mg | ORAL_TABLET | Freq: Four times a day (QID) | ORAL | 0 refills | Status: DC | PRN

## 2018-08-04 MED ORDER — OXYCODONE HCL 5 MG PO TABS: 5.0000 mg | ORAL_TABLET | Freq: Once | ORAL | Status: DC | PRN

## 2018-08-04 MED ORDER — LACTATED RINGERS IV SOLN
INTRAVENOUS | Status: DC
  Administered 2018-08-04: 10 mL/h via INTRAVENOUS

## 2018-08-04 MED ORDER — CEFAZOLIN SODIUM-DEXTROSE 2-4 GM/100ML-% IV SOLN
2.0000 g | INTRAVENOUS | Status: AC
  Administered 2018-08-04: 2 g via INTRAVENOUS

## 2018-08-04 MED ORDER — ACETAMINOPHEN 325 MG PO TABS: 325.0000 mg | ORAL_TABLET | ORAL | Status: DC | PRN

## 2018-08-04 MED ORDER — PHENYLEPHRINE HCL (PRESSORS) 10 MG/ML IV SOLN
INTRAVENOUS | Status: DC | PRN
  Administered 2018-08-04 (×4): 40 ug via INTRAVENOUS

## 2018-08-04 MED ORDER — FENTANYL CITRATE (PF) 100 MCG/2ML IJ SOLN: 50.0000 ug | INTRAMUSCULAR | Status: DC | PRN

## 2018-08-04 MED ORDER — LACTATED RINGERS IV SOLN
INTRAVENOUS | Status: DC
  Administered 2018-08-04: 10:00:00 via INTRAVENOUS

## 2018-08-04 MED ORDER — IBUPROFEN 200 MG PO TABS: 600.0000 mg | ORAL_TABLET | Freq: Four times a day (QID) | ORAL | Status: AC

## 2018-08-04 MED ORDER — MEPERIDINE HCL 25 MG/ML IJ SOLN: 6.2500 mg | INTRAMUSCULAR | Status: DC | PRN

## 2018-08-04 MED ORDER — PROPOFOL 10 MG/ML IV BOLUS
INTRAVENOUS | Status: DC | PRN
  Administered 2018-08-04: 150 mg via INTRAVENOUS

## 2018-08-04 MED ORDER — FENTANYL CITRATE (PF) 100 MCG/2ML IJ SOLN: 25.0000 ug | INTRAMUSCULAR | Status: DC | PRN

## 2018-08-04 MED ORDER — ACETAMINOPHEN 325 MG PO TABS: 650.0000 mg | ORAL_TABLET | Freq: Four times a day (QID) | ORAL | Status: AC

## 2018-08-04 MED ORDER — ONDANSETRON HCL 4 MG/2ML IJ SOLN
INTRAMUSCULAR | Status: DC | PRN
  Administered 2018-08-04: 4 mg via INTRAVENOUS

## 2018-08-04 MED ORDER — CEFAZOLIN SODIUM-DEXTROSE 2-4 GM/100ML-% IV SOLN
INTRAVENOUS | Status: AC
  Filled 2018-08-04: qty 100

## 2018-08-04 MED ORDER — OXYCODONE HCL 5 MG/5ML PO SOLN: 5.0000 mg | Freq: Once | ORAL | Status: DC | PRN

## 2018-08-04 MED ORDER — POVIDONE-IODINE 10 % EX SWAB: 2.0000 "application " | Freq: Once | CUTANEOUS | Status: DC

## 2018-08-04 MED ORDER — DEXAMETHASONE SODIUM PHOSPHATE 10 MG/ML IJ SOLN
INTRAMUSCULAR | Status: DC | PRN
Start: 2018-08-04 — End: 2018-08-04
  Administered 2018-08-04: 4 mg via INTRAVENOUS

## 2018-08-04 MED ORDER — LIDOCAINE HCL (CARDIAC) PF 100 MG/5ML IV SOSY
PREFILLED_SYRINGE | INTRAVENOUS | Status: DC | PRN
  Administered 2018-08-04: 30 mg via INTRAVENOUS

## 2018-08-04 MED ORDER — EPHEDRINE 5 MG/ML INJ
INTRAVENOUS | Status: AC
  Filled 2018-08-04: qty 10

## 2018-08-04 MED ORDER — 0.9 % SODIUM CHLORIDE (POUR BTL) OPTIME
TOPICAL | Status: DC | PRN
  Administered 2018-08-04: 10:00:00 1000 mL

## 2018-08-04 MED ORDER — PHENYLEPHRINE HCL (PRESSORS) 10 MG/ML IV SOLN
INTRAVENOUS | Status: AC
  Filled 2018-08-04: qty 2

## 2018-08-04 MED ORDER — MIDAZOLAM HCL 2 MG/2ML IJ SOLN
1.0000 mg | INTRAMUSCULAR | Status: DC | PRN
  Administered 2018-08-04: 2 mg via INTRAVENOUS

## 2018-08-04 MED ORDER — ACETAMINOPHEN 160 MG/5ML PO SOLN: 325.0000 mg | ORAL | Status: DC | PRN

## 2018-08-04 MED ORDER — BUPIVACAINE LIPOSOME 1.3 % IJ SUSP
INTRAMUSCULAR | Status: DC | PRN
  Administered 2018-08-04: 10 mL via PERINEURAL

## 2018-08-04 SURGICAL SUPPLY — 87 items
APL PRP STRL LF DISP 70% ISPRP (MISCELLANEOUS) ×1
BIT DRILL 2 FAST STEP (BIT) ×2 IMPLANT
BIT DRILL 2.5X4 QC (BIT) ×2 IMPLANT
BLADE MINI RND TIP GREEN BEAV (BLADE) IMPLANT
BLADE SURG 15 STRL LF DISP TIS (BLADE) ×1 IMPLANT
BLADE SURG 15 STRL SS (BLADE) ×3
BNDG CMPR 9X4 STRL LF SNTH (GAUZE/BANDAGES/DRESSINGS) ×1
BNDG COHESIVE 2X5 TAN STRL LF (GAUZE/BANDAGES/DRESSINGS) IMPLANT
BNDG COHESIVE 4X5 TAN STRL (GAUZE/BANDAGES/DRESSINGS) ×3 IMPLANT
BNDG ESMARK 4X9 LF (GAUZE/BANDAGES/DRESSINGS) ×3 IMPLANT
BNDG GAUZE 1X2.1 STRL (MISCELLANEOUS) IMPLANT
BNDG GAUZE ELAST 4 BULKY (GAUZE/BANDAGES/DRESSINGS) ×3 IMPLANT
BRUSH SCRUB EZ PLAIN DRY (MISCELLANEOUS) IMPLANT
CANISTER SUCT 1200ML W/VALVE (MISCELLANEOUS) ×3 IMPLANT
CHLORAPREP W/TINT 26 (MISCELLANEOUS) ×3 IMPLANT
CORD BIPOLAR FORCEPS 12FT (ELECTRODE) ×3 IMPLANT
COVER BACK TABLE REUSABLE LG (DRAPES) ×3 IMPLANT
COVER MAYO STAND REUSABLE (DRAPES) ×3 IMPLANT
COVER WAND RF STERILE (DRAPES) IMPLANT
CUFF TOURN SGL QUICK 18X4 (TOURNIQUET CUFF) ×2 IMPLANT
CUFF TOURN SGL QUICK 24 (TOURNIQUET CUFF)
CUFF TRNQT CYL 24X4X16.5-23 (TOURNIQUET CUFF) IMPLANT
DRAIN PENROSE 1/2X12 LTX STRL (WOUND CARE) IMPLANT
DRAPE C-ARM 42X72 X-RAY (DRAPES) ×3 IMPLANT
DRAPE EXTREMITY T 121X128X90 (DISPOSABLE) ×3 IMPLANT
DRAPE SURG 17X23 STRL (DRAPES) ×3 IMPLANT
DRSG ADAPTIC 3X8 NADH LF (GAUZE/BANDAGES/DRESSINGS) ×3 IMPLANT
DRSG EMULSION OIL 3X3 NADH (GAUZE/BANDAGES/DRESSINGS) ×3 IMPLANT
ELECT REM PT RETURN 9FT ADLT (ELECTROSURGICAL) ×3
ELECTRODE REM PT RTRN 9FT ADLT (ELECTROSURGICAL) ×1 IMPLANT
GAUZE SPONGE 4X4 12PLY STRL LF (GAUZE/BANDAGES/DRESSINGS) ×3 IMPLANT
GLOVE BIO SURGEON STRL SZ7 (GLOVE) ×2 IMPLANT
GLOVE BIO SURGEON STRL SZ7.5 (GLOVE) ×3 IMPLANT
GLOVE BIOGEL PI IND STRL 7.0 (GLOVE) ×1 IMPLANT
GLOVE BIOGEL PI IND STRL 7.5 (GLOVE) IMPLANT
GLOVE BIOGEL PI IND STRL 8 (GLOVE) ×1 IMPLANT
GLOVE BIOGEL PI INDICATOR 7.0 (GLOVE) ×2
GLOVE BIOGEL PI INDICATOR 7.5 (GLOVE) ×4
GLOVE BIOGEL PI INDICATOR 8 (GLOVE) ×2
GLOVE ECLIPSE 6.5 STRL STRAW (GLOVE) ×3 IMPLANT
GOWN STRL REUS W/ TWL LRG LVL3 (GOWN DISPOSABLE) ×2 IMPLANT
GOWN STRL REUS W/TWL LRG LVL3 (GOWN DISPOSABLE) ×6
GOWN STRL REUS W/TWL XL LVL3 (GOWN DISPOSABLE) ×3 IMPLANT
K-WIRE 1.6 (WIRE) ×9
K-WIRE FX5X1.6XNS BN SS (WIRE) ×3
KWIRE FX5X1.6XNS BN SS (WIRE) IMPLANT
NDL HYPO 25X1 1.5 SAFETY (NEEDLE) IMPLANT
NEEDLE HYPO 25X1 1.5 SAFETY (NEEDLE) IMPLANT
NS IRRIG 1000ML POUR BTL (IV SOLUTION) ×3 IMPLANT
PACK BASIN DAY SURGERY FS (CUSTOM PROCEDURE TRAY) ×3 IMPLANT
PAD CAST 4YDX4 CTTN HI CHSV (CAST SUPPLIES) IMPLANT
PADDING CAST ABS 4INX4YD NS (CAST SUPPLIES)
PADDING CAST ABS COTTON 4X4 ST (CAST SUPPLIES) IMPLANT
PADDING CAST COTTON 4X4 STRL (CAST SUPPLIES) ×3
PEG SUBCHONDRAL SMOOTH 2.0X20 (Peg) ×2 IMPLANT
PEG SUBCHONDRAL SMOOTH 2.0X22 (Peg) ×6 IMPLANT
PEG THREADED 2.5MMX22MM LONG (Peg) ×4 IMPLANT
PENCIL BUTTON HOLSTER BLD 10FT (ELECTRODE) ×3 IMPLANT
PLATE F3 FRAG HI STR (Plate) ×2 IMPLANT
PLATE XLONG 24.4X89.5 LT (Plate) ×2 IMPLANT
RUBBERBAND STERILE (MISCELLANEOUS) IMPLANT
SCREW BN 12X3.5XNS CORT TI (Screw) IMPLANT
SCREW CORT 3.5X12 (Screw) ×15 IMPLANT
SCREW CORT 3.5X14 LNG (Screw) ×2 IMPLANT
SCREW MULTI DIRECT 16MM (Screw) ×2 IMPLANT
SCREW PEG 2.5X14 NONLOCK (Screw) ×2 IMPLANT
SCREW PEG 2.5X18 NONLOCK (Screw) ×2 IMPLANT
SCREW PEG 2.5X20 NONLOCK (Screw) ×2 IMPLANT
SLEEVE SCD COMPRESS KNEE MED (MISCELLANEOUS) ×3 IMPLANT
SLING ARM FOAM STRAP LRG (SOFTGOODS) IMPLANT
SLING ARM FOAM STRAP XLG (SOFTGOODS) ×2 IMPLANT
SPLINT PLASTER CAST XFAST 3X15 (CAST SUPPLIES) IMPLANT
SPLINT PLASTER XTRA FASTSET 3X (CAST SUPPLIES) ×2
STOCKINETTE 6  STRL (DRAPES) ×2
STOCKINETTE 6 STRL (DRAPES) ×1 IMPLANT
SUCTION FRAZIER HANDLE 10FR (MISCELLANEOUS) ×2
SUCTION TUBE FRAZIER 10FR DISP (MISCELLANEOUS) ×1 IMPLANT
SUT VIC AB 2-0 PS2 27 (SUTURE) ×5 IMPLANT
SUT VICRYL 4-0 PS2 18IN ABS (SUTURE) IMPLANT
SUT VICRYL RAPIDE 4-0 (SUTURE) IMPLANT
SUT VICRYL RAPIDE 4/0 PS 2 (SUTURE) ×5 IMPLANT
SYR 10ML LL (SYRINGE) IMPLANT
SYR BULB 3OZ (MISCELLANEOUS) ×3 IMPLANT
TOWEL GREEN STERILE FF (TOWEL DISPOSABLE) ×3 IMPLANT
TUBE CONNECTING 20'X1/4 (TUBING) ×1
TUBE CONNECTING 20X1/4 (TUBING) ×2 IMPLANT
UNDERPAD 30X30 (UNDERPADS AND DIAPERS) ×3 IMPLANT

## 2018-08-04 NOTE — Anesthesia Postprocedure Evaluation (Signed)
Anesthesia Post Note  Patient: Pamela Dalton  Procedure(s) Performed: OPEN REDUCTION INTERNAL FIXATION (ORIF) LEFT DISTAL RADIAL FRACTURE (Left Wrist) OPEN LEFT CARPAL TUNNEL RELEASE (Left Wrist)     Patient location during evaluation: PACU Anesthesia Type: General Level of consciousness: awake and alert Pain management: pain level controlled Vital Signs Assessment: post-procedure vital signs reviewed and stable Respiratory status: spontaneous breathing, nonlabored ventilation, respiratory function stable and patient connected to nasal cannula oxygen Cardiovascular status: blood pressure returned to baseline and stable Postop Assessment: no apparent nausea or vomiting Anesthetic complications: no    Last Vitals:  Vitals:   08/04/18 1240 08/04/18 1255  BP:  110/72  Pulse: 94 96  Resp: (!) 28 20  Temp:  37.1 C  SpO2: 96% 96%    Last Pain:  Vitals:   08/04/18 1255  TempSrc:   PainSc: 0-No pain                 Jaeley Wiker

## 2018-08-04 NOTE — Discharge Instructions (Signed)
Post Anesthesia Home Care Instructions  Activity: Get plenty of rest for the remainder of the day. A responsible individual must stay with you for 24 hours following the procedure.  For the next 24 hours, DO NOT: -Drive a car -Paediatric nurse -Drink alcoholic beverages -Take any medication unless instructed by your physician -Make any legal decisions or sign important papers.  Meals: Start with liquid foods such as gelatin or soup. Progress to regular foods as tolerated. Avoid greasy, spicy, heavy foods. If nausea and/or vomiting occur, drink only clear liquids until the nausea and/or vomiting subsides. Call your physician if vomiting continues.  Special Instructions/Symptoms: Your throat may feel dry or sore from the anesthesia or the breathing tube placed in your throat during surgery. If this causes discomfort, gargle with warm salt water. The discomfort should disappear within 24 hours.  If you had a scopolamine patch placed behind your ear for the management of post- operative nausea and/or vomiting:  1. The medication in the patch is effective for 72 hours, after which it should be removed.  Wrap patch in a tissue and discard in the trash. Wash hands thoroughly with soap and water. 2. You may remove the patch earlier than 72 hours if you experience unpleasant side effects which may include dry mouth, dizziness or visual disturbances. 3. Avoid touching the patch. Wash your hands with soap and water after contact with the patch.      Regional Anesthesia Blocks  1. Numbness or the inability to move the "blocked" extremity may last from 3-48 hours after placement. The length of time depends on the medication injected and your individual response to the medication. If the numbness is not going away after 48 hours, call your surgeon.  2. The extremity that is blocked will need to be protected until the numbness is gone and the  Strength has returned. Because you cannot feel it, you  will need to take extra care to avoid injury. Because it may be weak, you may have difficulty moving it or using it. You may not know what position it is in without looking at it while the block is in effect.  3. For blocks in the legs and feet, returning to weight bearing and walking needs to be done carefully. You will need to wait until the numbness is entirely gone and the strength has returned. You should be able to move your leg and foot normally before you try and bear weight or walk. You will need someone to be with you when you first try to ensure you do not fall and possibly risk injury.  4. Bruising and tenderness at the needle site are common side effects and will resolve in a few days.  5. Persistent numbness or new problems with movement should be communicated to the surgeon or the [place] [phone] [place] [phone]).   Information for Discharge Teaching: EXPAREL (bupivacaine liposome injectable suspension)   Your surgeon or anesthesiologist gave you EXPAREL(bupivacaine) to help control your pain after surgery.   EXPAREL is a local anesthetic that provides pain relief by numbing the tissue around the surgical site.  EXPAREL is designed to release pain medication over time and can control pain for up to 72 hours.  Depending on how you respond to EXPAREL, you may require less pain medication during your recovery.  Possible side effects:  Temporary loss of sensation or ability to move in the area where bupivacaine was injected.  Nausea, vomiting, constipation  Rarely, numbness and tingling in your mouth or lips, lightheadedness, or anxiety may occur.  Call your doctor right away if you think you may be experiencing any of these sensations, or if you have other questions regarding possible side effects.  Follow all other discharge instructions given to you by your surgeon or nurse. Eat a healthy diet and drink plenty of water or  other fluids.  If you return to the hospital for any reason within 96 hours following the administration of EXPAREL, it is important for health care providers to know that you have received this anesthetic. A teal colored band has been placed on your arm with the date, time and amount of EXPAREL you have received in order to alert and inform your health care providers. Please leave this armband in place for the full 96 hours following administration, and then you may remove the band.   Discharge Instructions   You have a dressing with a plaster splint incorporated in it. Move your fingers as much as possible, making a full fist and fully opening the fist. Elevate your hand to reduce pain & swelling of the digits.  Ice over the operative site may be helpful to reduce pain & swelling.  DO NOT USE HEAT. Pain medicine has been prescribed for you.  Take 650 mg Ibuprofen and 600 mg of Tylenol every 6 hours. Take the Oxycodone 5 mg additionally for severe post operative pain. Leave the dressing in place until you return to our office.  You may shower, but keep the bandage clean & dry.  You may drive a car when you are off of prescription pain medications and can safely control your vehicle with both hands. Our office will call you to arrange follow-up   Please call [phone] during normal business hours or [phone] after hours for any problems. Including the following:  - excessive redness of the incisions - drainage for more than 4 days - fever of more than 101.5 F  *Please note that pain medications will not be refilled after hours or on weekends.

## 2018-08-04 NOTE — Interval H&P Note (Signed)
History and Physical Interval Note:  08/04/2018 9:57 AM  Pamela Dalton  has presented today for surgery, with the diagnosis of LEFT DISTAL RADIUS FRACTURE, LEFT CARPAL TUNNEL SYNDROME.  The various methods of treatment have been discussed with the patient and family. After consideration of risks, benefits and other options for treatment, the patient has consented to  Procedure(s): OPEN REDUCTION INTERNAL FIXATION (ORIF) LEFT DISTAL RADIAL FRACTURE (Left) OPEN LEFT CARPAL TUNNEL RELEASE (Left) as a surgical intervention.  The patient's history has been reviewed, patient examined, no change in status, stable for surgery.  I have reviewed the patient's chart and labs.  Questions were answered to the patient's satisfaction.     Jolyn Nap

## 2018-08-04 NOTE — Progress Notes (Signed)
Assisted Dr. Oddono with left, ultrasound guided, supraclavicular block. Side rails up, monitors on throughout procedure. See vital signs in flow sheet. Tolerated Procedure well. 

## 2018-08-04 NOTE — Anesthesia Procedure Notes (Signed)
Anesthesia Regional Block: Supraclavicular block   Pre-Anesthetic Checklist: ,, timeout performed, Correct Patient, Correct Site, Correct Laterality, Correct Procedure, Correct Position, site marked, Risks and benefits discussed,  Surgical consent,  Pre-op evaluation,  At surgeon's request and post-op pain management  Laterality: Left  Prep: chloraprep       Needles:  Injection technique: Single-shot  Needle Type: Echogenic Stimulator Needle     Needle Length: 5cm  Needle Gauge: 22     Additional Needles:   Procedures:, nerve stimulator,,, ultrasound used (permanent image in chart),,,,   Nerve Stimulator or Paresthesia:  Response: hand, 0.45 mA,   Additional Responses:   Narrative:  Start time: 08/04/2018 9:05 AM End time: 08/04/2018 9:10 AM Injection made incrementally with aspirations every 5 mL.  Performed by: Personally  Anesthesiologist: Janeece Riggers, MD  Additional Notes: Functioning IV was confirmed and monitors were applied.  A 35mm 22ga Arrow echogenic stimulator needle was used. Sterile prep and drape,hand hygiene and sterile gloves were used. Ultrasound guidance: relevant anatomy identified, needle position confirmed, local anesthetic spread visualized around nerve(s)., vascular puncture avoided.  Image printed for medical record. Negative aspiration and negative test dose prior to incremental administration of local anesthetic. The patient tolerated the procedure well.

## 2018-08-04 NOTE — Op Note (Signed)
08/04/2018  7:49 AM  PATIENT:  Pamela Dalton  68 y.o. female  PRE-OPERATIVE DIAGNOSIS:  1.  Comminuted displaced intra-articular multifragment left distal radius fracture (3+)      2.  Left carpal tunnel syndrome POST-OPERATIVE DIAGNOSIS:  Same  PROCEDURE:  1.  ORIF left comminuted displaced intra-articular distal radius fracture, 3+ fragments, 25609    2.  Left open carpal tunnel release  SURGEON: Rayvon Char. Grandville Silos, MD  PHYSICIAN ASSISTANT: Morley Kos, OPA-C  ANESTHESIA:  regional and general  SPECIMENS:  None  DRAINS: None  EBL:  less than 50 mL  PREOPERATIVE INDICATIONS:  Pamela Dalton is a  68 y.o. female with comminuted displaced left distal radius fracture  The risks benefits and alternatives were discussed with the patient preoperatively including but not limited to the risks of infection, bleeding, nerve injury, cardiopulmonary complications, the need for revision surgery, among others, and the patient verbalized understanding and consented to proceed.  OPERATIVE IMPLANTS: Biomet DVR extended length plate/screws/pegs and straight F3 high-strength plate  OPERATIVE PROCEDURE: After receiving prophylactic antibiotics, the patient was escorted to the operative theatre and placed in a supine position.  General anesthesia was administered.  A surgical "time-out" was performed during which the planned procedure, proposed operative site, and the correct patient identity were compared to the operative consent and agreement confirmed by the circulating nurse according to current facility policy. Following application of a tourniquet to the operative extremity, the exposed skin was pre-scrubbed with Hibiclens scrub brush and then was prepped with Chloraprep and draped in the usual sterile fashion. The limb was exsanguinated with an Esmarch bandage and the tourniquet inflated to approximately 130mmHg higher than systolic BP.   A sinusoidal-shaped incision was marked and  made over the FCR axis in the distal forearm, extended to the mid palm to include exposure for an open carpal tunnel release. The skin was incised sharply with scalpel, subcutaneous tissues with blunt and spreading dissection.   The open carpal tunnel release was performed first, dividing the palmar fascia and the underlying transverse carpal ligament under direct visualization, with care to identify and protect the palmar cutaneous branch of the median nerve.  There was extension of hemorrhage and swelling into the carpal tunnel, and the nerve was hourglass narrowed.  After performing the carpal tunnel release, attention was directed to the radius fracture:  The forearm fascia was split underlying the FCR.  The distal aspect of the PQ was reflected in an L-shaped, the more proximal portions were shredded.  I initially selected a standard plate, and when attempting to apply it to the volar aspect of the distal radius, observed a nondisplaced sagittal oblique fracture that extended into the metadiaphysis.  For this reason the extended length plate was selected.  . It was placed in its provisional alignment of the radius and this was confirmed fluoroscopically.  It was secured to the radius with a screw through the slotted hole and 2 additional K wires for fixation.  Additional adjustments were made as necessary, and the distal holes were all drilled and filled.  Peg/screw length distally was selected on the shorter side of measurements to minimize the risk for dorsal cortical penetration.  Prior to securing the proximal portions of the plate, and F3 plate was selected and placed on the radial aspect of the diaphysis, extending to the metadiaphysis, bridging the oblique sagittal fracture line.  It was secured with 3 different screws to the radius, then the proximal holes of the DVR  plate were filled.   Final images were obtained and the DRUJ was examined for stability. It was found to be sufficiently stable. The  wound was then copiously irrigated.  A 2-0 Vicryl suture was looped in the volar capsule and secured around the distal T portion of the plate.  The distal aspect of PQ was repaired with the same suture type.  Tourniquet was released and additional hemostasis obtained and the skin was closed with 2-0 Vicryl deep dermal buried sutures followed by running 4-0 Vicryl Rapide horizontal mattress suture in the skin. A bulky dressing with a volar plaster component was applied and the patient was taken to the recovery room in stable condition.  DISPOSITION: The patient will be discharged home today with typical post-op instructions, returning in 10-15 days for reevaluation with new x-rays of the affected wrist out of the splint to include an inclined lateral and then transition to therapy to have a custom splint constructed and begin rehabilitation.

## 2018-08-04 NOTE — Transfer of Care (Signed)
Immediate Anesthesia Transfer of Care Note  Patient: Pamela Dalton  Procedure(s) Performed: OPEN REDUCTION INTERNAL FIXATION (ORIF) LEFT DISTAL RADIAL FRACTURE (Left Wrist) OPEN LEFT CARPAL TUNNEL RELEASE (Left Wrist)  Patient Location: PACU  Anesthesia Type:GA combined with regional for post-op pain  Level of Consciousness: awake, alert , oriented and patient cooperative  Airway & Oxygen Therapy: Patient Spontanous Breathing and Patient connected to nasal cannula oxygen  Post-op Assessment: Report given to RN and Post -op Vital signs reviewed and stable  Post vital signs: Reviewed and stable  Last Vitals:  Vitals Value Taken Time  BP    Temp    Pulse 96 08/04/2018 11:57 AM  Resp 26 08/04/2018 11:57 AM  SpO2 99 % 08/04/2018 11:57 AM  Vitals shown include unvalidated device data.  Last Pain:  Vitals:   08/04/18 0837  TempSrc: Oral  PainSc: 3          Complications: No apparent anesthesia complications

## 2018-08-04 NOTE — Anesthesia Preprocedure Evaluation (Signed)
Anesthesia Evaluation  Patient identified by MRN, date of birth, ID band Patient awake    Reviewed: Allergy & Precautions, H&P , NPO status , Patient's Chart, lab work & pertinent test results  Airway Mallampati: I  TM Distance: >3 FB Neck ROM: Full    Dental  (+) Edentulous Upper, Edentulous Lower, Dental Advisory Given   Pulmonary asthma , former smoker,    breath sounds clear to auscultation       Cardiovascular hypertension, Pt. on medications  Rhythm:Regular     Neuro/Psych  Headaches, PSYCHIATRIC DISORDERS Anxiety  Neuromuscular disease    GI/Hepatic Neg liver ROS, GERD  Medicated and Controlled,  Endo/Other  diabetes, Well Controlled, Type 2, Oral Hypoglycemic AgentsMorbid obesity  Renal/GU negative Renal ROS  negative genitourinary   Musculoskeletal  (+) Arthritis , Osteoarthritis,    Abdominal   Peds  Hematology   Anesthesia Other Findings   Reproductive/Obstetrics negative OB ROS                             Anesthesia Physical  Anesthesia Plan  ASA: III  Anesthesia Plan: General   Post-op Pain Management: GA combined w/ Regional for post-op pain   Induction: Intravenous  PONV Risk Score and Plan: 3 and Ondansetron, Treatment may vary due to age or medical condition and Midazolam  Airway Management Planned: Oral ETT and LMA  Additional Equipment:   Intra-op Plan:   Post-operative Plan: Extubation in OR  Informed Consent: I have reviewed the patients History and Physical, chart, labs and discussed the procedure including the risks, benefits and alternatives for the proposed anesthesia with the patient or authorized representative who has indicated his/her understanding and acceptance.     Dental advisory given  Plan Discussed with: CRNA, Anesthesiologist and Surgeon  Anesthesia Plan Comments:         Anesthesia Quick Evaluation

## 2018-08-04 NOTE — Anesthesia Procedure Notes (Signed)
Procedure Name: LMA Insertion Date/Time: 08/04/2018 10:12 AM Performed by: Signe Colt, CRNA Pre-anesthesia Checklist: Patient identified, Emergency Drugs available, Suction available and Patient being monitored Patient Re-evaluated:Patient Re-evaluated prior to induction Oxygen Delivery Method: Circle system utilized Preoxygenation: Pre-oxygenation with 100% oxygen Induction Type: IV induction Ventilation: Mask ventilation without difficulty LMA: LMA inserted LMA Size: 4.0 Number of attempts: 1 Airway Equipment and Method: Bite block Placement Confirmation: positive ETCO2 Tube secured with: Tape Dental Injury: Teeth and Oropharynx as per pre-operative assessment

## 2018-08-05 ENCOUNTER — Encounter (HOSPITAL_BASED_OUTPATIENT_CLINIC_OR_DEPARTMENT_OTHER): Payer: Self-pay | Admitting: Orthopedic Surgery

## 2018-08-31 ENCOUNTER — Other Ambulatory Visit: Payer: Self-pay | Admitting: *Deleted

## 2018-08-31 MED ORDER — TOPIRAMATE 25 MG PO TABS: ORAL_TABLET | ORAL | 2 refills | Status: DC

## 2018-09-22 ENCOUNTER — Telehealth: Payer: Self-pay

## 2018-09-22 NOTE — Telephone Encounter (Signed)
Unable to get in contact with the patient to convert their office appt with Sarah on 09/23/2018 into a mychart video visit. I left a voicemail asking the patient to return my call. Office number was provided.    If patient calls back please convert their office visit into a mychart video visit.

## 2018-09-23 ENCOUNTER — Ambulatory Visit: Payer: Medicare HMO | Admitting: Neurology

## 2018-10-05 NOTE — Progress Notes (Signed)
PATIENT: Pamela Dalton DOB: 06-Dec-1950  REASON FOR VISIT: follow up headache  HISTORY FROM: patient  HISTORY OF PRESENT ILLNESS: Today 10/06/18  Pamela Dalton is a 68 year old female with history of headache syndrome.  She is currently taking Topamax and Elavil at bedtime.  Her headaches have improved.  At her last visit, her ESR was mildly elevated, with no clinical correlation.  Her headaches are under good control.  She reports she has had 2 mild headaches in the last 6 to 8 months.  She uses Imitrex nasal spray for acute headache relief.  She reports this has been very beneficial. She reports she can use 1 nasal spray with relief of her headache.  She did have a fall as of recent, had to have surgery for a fractured left arm in May 2020.  Otherwise she denies any new problems or concerns.  She is happy with her headache control.  HISTORY 12/18/2019CM Pamela Dalton, 68 year old female returns for follow-up with a history of headache syndrome.  When last seen by Dr. Jannifer Franklin she was having severe headaches frequently.  MRI of the brain was unremarkable.  She was placed on Topamax titrating to 75 mg at bedtime and Elavil titrating to 75 mg at bedtime.  Her headaches are improved tremendously and she is really pleased with her response.  ESR was mildly elevated when last seen today.  She has no new neurologic complaints.  She returns for reevaluation  9/19/19[name]. Dalton is a 68 year old right-handed black female with a history of migraine headaches dating back to her early 39s. The patient generally will have a headache once every 3 weeks or so, the headaches are usually not extremely severe and usually in the bitemporal regionsassociated with a throbbing sensation. The patient may have photophobia and phonophobia, she usually does not get blurred vision, but she may have some nausea and vomiting. Stress and spicy foods are sometimes triggers for the headache. About a month ago, the  patient had a slow build up of a severe headache. The patient was having more frequent headaches 3 weeks prior to the onset of the more severe headache, and then eventually the headache has become daily since that time. The headache was quite severe, unlike any headaches that she has had previously. The headache includesthe entire head, there was neck stiffness, blurred vision, severe nausea vomiting and severe photophobia and phonophobia. The patient was given a trial on a prednisone Dosepak which did not help. She was placed on amitriptyline, she currently takes 3.5 of the 25 mg tablets daily without benefit. She has been given Imitrex nasal spray which does seem to be helpful. The patient may take Excedrin Migraine as well and she has Zofran for nausea. The patient does have a history of diabetes, but her hemoglobin A1c runs around 6. The patient is sent to this office for an evaluation. She does have a family history of migraine headaches inthe daughter. The patient does report some numbness of the hands bilaterally, butno numbness in the feet. She denies any weakness. She indicates that she does drink ice tea sometimes, but does not take in a lot of caffeinated products during the day otherwise  REVIEW OF SYSTEMS: Out of a complete 14 system review of symptoms, the patient complains only of the following symptoms, and all other reviewed systems are negative.  Headache  ALLERGIES: Allergies  Allergen Reactions  . Codeine     REACTION: Rash and itching  . Fentanyl Nausea And Vomiting  Had hard time waking up    HOME MEDICATIONS: Outpatient Medications Prior to Visit  Medication Sig Dispense Refill  . acetaminophen (TYLENOL) 325 MG tablet Take 2 tablets (650 mg total) by mouth every 6 (six) hours.    [name] amitriptyline (ELAVIL) 25 MG tablet TAKE 3 TABLETS(75 MG) BY MOUTH AT BEDTIME 270 tablet 1  . amLODipine (NORVASC) 5 MG tablet Take 5 mg by mouth daily.     [name] aspirin 81 MG  tablet Take 81 mg by mouth daily.    . benazepril (LOTENSIN) 10 MG tablet Take 10 mg by mouth daily.     . cetirizine (ZYRTEC) 10 MG tablet Take 10 mg by mouth daily.    [name] esomeprazole (NEXIUM) 20 MG capsule Take 20 mg by mouth daily as needed (acid reflux).     . Fluticasone-Salmeterol (ADVAIR) 100-50 MCG/DOSE AEPB Inhale 1 puff into the lungs every 12 (twelve) hours.    . folic acid (FOLVITE) 1 MG tablet Take 1 mg by mouth daily.    [name] ibuprofen (ADVIL) 200 MG tablet Take 3 tablets (600 mg total) by mouth every 6 (six) hours.    [name] LORazepam (ATIVAN) 1 MG tablet Take 1 mg by mouth every 8 (eight) hours.    [name] losartan-hydrochlorothiazide (HYZAAR) 100-25 MG per tablet Take 1 tablet by mouth daily.    . montelukast (SINGULAIR) 10 MG tablet Take 10 mg by mouth at bedtime.     . Olopatadine HCl (PATADAY) 0.2 % SOLN Place 1 drop into both eyes daily.    [name] oxybutynin (DITROPAN) 5 MG tablet Take 1 tablet (5 mg total) by mouth 3 (three) times daily. 90 tablet 3  . topiramate (TOPAMAX) 25 MG tablet 3 TABLETS AT NIGHT 90 tablet 2  . valACYclovir (VALTREX) 500 MG tablet Take 500 mg by mouth daily.    [name] oxyCODONE (ROXICODONE) 5 MG immediate release tablet Take 1 tablet (5 mg total) by mouth every 6 (six) hours as needed (severe postop pain). 28 tablet 0   No facility-administered medications prior to visit.     PAST MEDICAL HISTORY: Past Medical History:  Diagnosis Date  . Anxiety   . Arthritis   . Asthma    Advair as needed  . Back pain   . Common migraine with intractable migraine 12/24/2017  . DDD (degenerative disc disease)   . Full dentures   . GERD (gastroesophageal reflux disease)   . Hypertension   . Left wrist fracture   . Leg pain   . Nerve pain   . Wears glasses     PAST SURGICAL HISTORY: Past Surgical History:  Procedure Laterality Date  . ABDOMINAL HYSTERECTOMY  1981   one ovary/ tube removed  . BACK SURGERY  2005   L4, L5 fused  . Back surgery, 12/27/2012 L3 replaced  cushion, L4/L5 fused  12/27/2012  . CARPAL TUNNEL RELEASE Left 08/04/2018   Procedure: OPEN LEFT CARPAL TUNNEL RELEASE;  Surgeon: Milly Jakob, MD;  Location: [place];  Service: Orthopedics;  Laterality: Left;  . COLONOSCOPY    . OPEN REDUCTION INTERNAL FIXATION (ORIF) DISTAL RADIAL FRACTURE Left 08/04/2018   Procedure: OPEN REDUCTION INTERNAL FIXATION (ORIF) LEFT DISTAL RADIAL FRACTURE;  Surgeon: Milly Jakob, MD;  Location: [place];  Service: Orthopedics;  Laterality: Left;  . SHOULDER ARTHROSCOPY WITH ROTATOR CUFF REPAIR AND SUBACROMIAL DECOMPRESSION Right 10/31/2013   Procedure: Right shoulder arthroscopy rotator cuff debridement, subacromial decompression, distal clavical excision;  Surgeon: Nita Sells, MD;  Location: [place];  Service: Orthopedics;  Laterality: Right;  Right shoulder arthroscopy rotator cuff debridement, subacromial decompression, distal clavical excision  . Thumb surgery- joint removed  2009   Arthritis-rt  . THYROID CYST EXCISION  2009  . TONSILLECTOMY      FAMILY HISTORY: Family History  Problem Relation Age of Onset  . Alzheimer's disease Father   . Parkinsonism Brother   . Migraines Daughter   . Cancer - Prostate Brother   . Breast cancer Neg Hx     SOCIAL HISTORY: Social History   Socioeconomic History  . Marital status: Divorced    Spouse name: Not on file  . Number of children: Not on file  . Years of education: Not on file  . Highest education level: Not on file  Occupational History  . Not on file  Social Needs  . Financial resource strain: Not on file  . Food insecurity    Worry: Not on file    Inability: Not on file  . Transportation needs    Medical: Not on file    Non-medical: Not on file  Tobacco Use  . Smoking status: Former Smoker    Quit date: 10/27/2003    Years since quitting: 14.9  . Smokeless tobacco: Never Used  . Tobacco comment: Quit smoking 2007   Substance and Sexual Activity  . Alcohol use: No    Comment: stopped drinking 2009  . Drug use: No    Comment: past history marijauna 2013  . Sexual activity: Not Currently    Birth control/protection: Surgical  Lifestyle  . Physical activity    Days per week: Not on file    Minutes per session: Not on file  . Stress: Not on file  Relationships  . Social Herbalist on phone: Not on file    Gets together: Not on file    Attends religious service: Not on file    Active member of club or organization: Not on file    Attends meetings of clubs or organizations: Not on file    Relationship status: Not on file  . Intimate partner violence    Fear of current or ex partner: Not on file    Emotionally abused: Not on file    Physically abused: Not on file    Forced sexual activity: Not on file  Other Topics Concern  . Not on file  Social History Narrative  . Not on file      PHYSICAL EXAM  Vitals:   10/06/18 1331  BP: 110/84  Pulse: (!) 112  Temp: 98.2 F (36.8 C)  Weight: 252 lb 8 oz (114.5 kg)  Height: '5\' 2"'  (1.575 m)   Body mass index is 46.18 kg/m.  Generalized: Well developed, in no acute distress, HR recheck 100  Neurological examination  Mentation: Alert oriented to time, place, history taking. Follows all commands speech and language fluent Cranial nerve II-XII: Pupils were equal round reactive to light. Extraocular movements were full, visual field were full on confrontational test. Facial sensation and strength were normal. Uvula tongue midline. Head turning and shoulder shrug  were normal and symmetric. Motor: The motor testing reveals 5 over 5 strength of all 4 extremities, but 4/5 in LUE with splint. Good symmetric motor tone is noted throughout.  Sensory: Sensory testing is intact to soft touch on all 4 extremities. No evidence of extinction is noted.  Coordination: Cerebellar testing reveals good finger-nose-finger and heel-to-shin bilaterally.   Gait  and station: Gait is normal. Tandem gait is normal. Romberg is negative. No drift is seen.  Reflexes: Deep tendon reflexes are symmetric and normal bilaterally.   DIAGNOSTIC DATA (LABS, IMAGING, TESTING) - I reviewed patient records, labs, notes, testing and imaging myself where available.  Lab Results  Component Value Date   WBC 6.8 11/13/2013   HGB 11.4 (L) 11/13/2013   HCT 35.9 (L) 11/13/2013   MCV 85.3 11/13/2013   PLT 278 11/13/2013      Component Value Date/Time   NA 140 08/04/2018 0821   K 3.5 08/04/2018 0821   CL 103 08/04/2018 0821   CO2 23 08/04/2018 0821   GLUCOSE 164 (H) 08/04/2018 0821   BUN 10 08/04/2018 0821   CREATININE 0.83 08/04/2018 0821   CALCIUM 10.7 (H) 08/04/2018 0821   CALCIUM 10.4 03/16/2010 0057   PROT 7.3 02/21/2010 2259   ALBUMIN 4.4 02/21/2010 2259   AST 16 02/21/2010 2259   ALT 12 02/21/2010 2259   ALKPHOS 60 02/21/2010 2259   BILITOT 0.4 02/21/2010 2259   GFRNONAA >60 08/04/2018 0821   GFRAA >60 08/04/2018 0821   Lab Results  Component Value Date   CHOL 152 02/21/2010   HDL 58 02/21/2010   LDLCALC 80 02/21/2010   TRIG 68 02/21/2010   CHOLHDL 2.6 Ratio 02/21/2010   Lab Results  Component Value Date   HGBA1C 5.5 05/07/2010   No results found for: VITAMINB12 Lab Results  Component Value Date   TSH 0.613 08/16/2007      ASSESSMENT AND PLAN 68 y.o. year old female  has a past medical history of Anxiety, Arthritis, Asthma, Back pain, Common migraine with intractable migraine (12/24/2017), DDD (degenerative disc disease), Full dentures, GERD (gastroesophageal reflux disease), Hypertension, Left wrist fracture, Leg pain, Nerve pain, and Wears glasses. here with:  1.  Headache  She indicates excellent control of her headaches with Topamax and amitriptyline daily.  I will send in a refill of Imitrex nasal spray, 5 mg as needed.  She has used this in the past, it has been beneficial.  We discussed the importance of not overusing  triptan.  I have provided a refill of Topamax.  She will follow-up in 6 months or sooner if needed.  In the past her ESR has been mildly elevated, without clinical significance.  We will follow this over time with CRP. She has established with a new primary care provider and has been very pleased, [facility].    I spent 15 minutes with the patient. 50% of this time was spent discussing her plan of care.    Butler Denmark, AGNP-C, DNP 10/06/2018, 1:42 PM [facility] [address], [place], [place] [address] [phone]

## 2018-10-06 ENCOUNTER — Other Ambulatory Visit: Payer: Self-pay

## 2018-10-06 ENCOUNTER — Encounter: Payer: Self-pay | Admitting: Neurology

## 2018-10-06 ENCOUNTER — Ambulatory Visit (INDEPENDENT_AMBULATORY_CARE_PROVIDER_SITE_OTHER): Payer: Medicare HMO | Admitting: Neurology

## 2018-10-06 VITALS — BP 110/84 | HR 112 | Temp 98.2°F | Ht 62.0 in | Wt 252.5 lb

## 2018-10-06 DIAGNOSIS — G43009 Migraine without aura, not intractable, without status migrainosus: Secondary | ICD-10-CM | POA: Diagnosis not present

## 2018-10-06 MED ORDER — TOPIRAMATE 25 MG PO TABS: ORAL_TABLET | ORAL | 1 refills | Status: DC

## 2018-10-06 MED ORDER — SUMATRIPTAN 5 MG/ACT NA SOLN: 1.0000 | NASAL | 3 refills | Status: AC | PRN

## 2018-10-06 NOTE — Progress Notes (Signed)
I have read the note, and I agree with the clinical assessment and plan.  Tajah Noguchi K Yardley Lekas   

## 2018-11-01 ENCOUNTER — Other Ambulatory Visit: Payer: Self-pay | Admitting: Family

## 2018-11-01 DIAGNOSIS — Z1231 Encounter for screening mammogram for malignant neoplasm of breast: Secondary | ICD-10-CM

## 2018-11-11 ENCOUNTER — Other Ambulatory Visit: Payer: Self-pay | Admitting: *Deleted

## 2018-11-11 MED ORDER — TOPIRAMATE 25 MG PO TABS: ORAL_TABLET | ORAL | 1 refills | Status: DC

## 2018-11-11 NOTE — Telephone Encounter (Signed)
Received request for Topiramate from [place] mail delivery. I called [facility]. Pt had not picked up the Topiramate from there. Topiramate 25 mg tablet (take 3 tablets by mouth at night) e-scribed to [facility]. Receipt confirmed by pharmacy.

## 2018-12-01 ENCOUNTER — Other Ambulatory Visit: Payer: Self-pay | Admitting: Neurology

## 2018-12-14 ENCOUNTER — Other Ambulatory Visit: Payer: Self-pay | Admitting: Neurology

## 2018-12-16 ENCOUNTER — Telehealth: Payer: Self-pay | Admitting: *Deleted

## 2018-12-16 NOTE — Telephone Encounter (Signed)
I spoke to pt and she is not sure cost of spray, but if it is costly, then she would like to go to tablet, sumatriptan.  She will call pharmacy and then let us know.

## 2018-12-16 NOTE — Telephone Encounter (Signed)
Tried to reach out to pt and her mailbox was full, not able to LM.

## 2018-12-16 NOTE — Telephone Encounter (Signed)
Received from [facility], per pt request to find different alternative other then imitrex nasal spray (cost savings). Naratriptan, rizatriptan or sumatriptan tablets.  See p/w in box.

## 2018-12-21 ENCOUNTER — Ambulatory Visit
Admission: RE | Admit: 2018-12-21 | Discharge: 2018-12-21 | Disposition: A | Discharge status: Home / Self Care | Payer: Medicare HMO | Source: Ambulatory Visit | Attending: Family | Admitting: Family

## 2018-12-21 ENCOUNTER — Other Ambulatory Visit: Payer: Self-pay

## 2018-12-21 DIAGNOSIS — Z1231 Encounter for screening mammogram for malignant neoplasm of breast: Secondary | ICD-10-CM

## 2019-01-06 ENCOUNTER — Other Ambulatory Visit: Payer: Self-pay | Admitting: Family

## 2019-01-06 DIAGNOSIS — M810 Age-related osteoporosis without current pathological fracture: Secondary | ICD-10-CM

## 2019-01-10 ENCOUNTER — Encounter: Payer: Self-pay | Admitting: Gastroenterology

## 2019-03-28 ENCOUNTER — Other Ambulatory Visit: Payer: Self-pay | Admitting: Family

## 2019-03-28 DIAGNOSIS — Z13820 Encounter for screening for osteoporosis: Secondary | ICD-10-CM

## 2019-04-12 ENCOUNTER — Other Ambulatory Visit: Payer: Medicare HMO

## 2019-04-16 ENCOUNTER — Other Ambulatory Visit: Payer: Self-pay | Admitting: Neurology

## 2019-06-09 ENCOUNTER — Ambulatory Visit
Admission: RE | Admit: 2019-06-09 | Discharge: 2019-06-09 | Disposition: A | Discharge status: Home / Self Care | Payer: Medicare HMO | Source: Ambulatory Visit | Attending: Allergy and Immunology | Admitting: Allergy and Immunology

## 2019-06-09 ENCOUNTER — Other Ambulatory Visit: Payer: Self-pay

## 2019-06-09 ENCOUNTER — Other Ambulatory Visit: Payer: Self-pay | Admitting: Allergy and Immunology

## 2019-06-09 DIAGNOSIS — R0602 Shortness of breath: Secondary | ICD-10-CM

## 2019-06-14 ENCOUNTER — Other Ambulatory Visit: Payer: Self-pay | Admitting: *Deleted

## 2019-06-14 MED ORDER — AMITRIPTYLINE HCL 25 MG PO TABS: ORAL_TABLET | ORAL | 1 refills | Status: DC

## 2019-06-20 NOTE — Progress Notes (Signed)
PATIENT: Pamela Dalton DOB: 1950-09-24  REASON FOR VISIT: follow up HISTORY FROM: patient  HISTORY OF PRESENT ILLNESS: Today 06/21/19  Pamela Dalton is a 69 year old female with history of headache syndrome.  She remains on Topamax and Elavil at bedtime.  She has previously used Imitrex nasal spray for acute relief, but insurance will no longer cover.  She has not had migraines since last seen.  If she has a headache she will take Tylenol or ibuprofen.  She is tolerating preventive medications without side effect.  She is currently having issues with allergies, has seen an allergist, fairly significant.  She also is having hoarseness, says it is felt that her acid reflux may have damaged her vocal cords.  She will be seeing ENT today.  She denies any other problems or concerns.  She has primary care at [facility].  She presents today for evaluation unaccompanied.  HISTORY 10/06/2018 SS: Pamela Dalton is a 69 year old female with history of headache syndrome.  She is currently taking Topamax and Elavil at bedtime.  Her headaches have improved.  At her last visit, her ESR was mildly elevated, with no clinical correlation.  Her headaches are under good control.  She reports she has had 2 mild headaches in the last 6 to 8 months.  She uses Imitrex nasal spray for acute headache relief.  She reports this has been very beneficial. She reports she can use 1 nasal spray with relief of her headache.  She did have a fall as of recent, had to have surgery for a fractured left arm in May 2020.  Otherwise she denies any new problems or concerns.  She is happy with her headache control.  REVIEW OF SYSTEMS: Out of a complete 14 system review of symptoms, the patient complains only of the following symptoms, and all other reviewed systems are negative.  Headache  ALLERGIES: Allergies  Allergen Reactions  . Codeine     REACTION: Rash and itching  . Fentanyl Nausea And Vomiting    Had hard time waking  up    HOME MEDICATIONS: Outpatient Medications Prior to Visit  Medication Sig Dispense Refill  . acetaminophen (TYLENOL) 325 MG tablet Take 2 tablets (650 mg total) by mouth every 6 (six) hours.    [name] albuterol (VENTOLIN HFA) 108 (90 Base) MCG/ACT inhaler Inhale into the lungs every 6 (six) hours as needed for wheezing or shortness of breath.    [name] amitriptyline (ELAVIL) 25 MG tablet TAKE 3 TABLETS(75 MG) BY MOUTH AT BEDTIME 270 tablet 1  . amLODipine (NORVASC) 10 MG tablet Take 10 mg by mouth daily.    [name] aspirin 81 MG tablet Take 81 mg by mouth daily.    [name] azelastine (OPTIVAR) 0.05 % ophthalmic solution 1 drop 2 (two) times daily.    . cetirizine (ZYRTEC) 10 MG tablet Take 10 mg by mouth daily.    . Cholecalciferol (VITAMIN D3 PO) Take 1,000 Units by mouth daily.    [name] esomeprazole (NEXIUM) 20 MG capsule Take 20 mg by mouth daily as needed (acid reflux).     [name] ibuprofen (ADVIL) 200 MG tablet Take 3 tablets (600 mg total) by mouth every 6 (six) hours.    [name] losartan-hydrochlorothiazide (HYZAAR) 100-25 MG per tablet Take 1 tablet by mouth daily.    . mirabegron ER (MYRBETRIQ) 50 MG TB24 tablet Take 50 mg by mouth daily.    . montelukast (SINGULAIR) 10 MG tablet Take 10 mg by mouth at bedtime.     [name]  oxybutynin (DITROPAN) 5 MG tablet Take 1 tablet (5 mg total) by mouth 3 (three) times daily. 90 tablet 3  . SUMAtriptan (IMITREX) 5 MG/ACT nasal spray Place 1 spray (5 mg total) into the nose every 2 (two) hours as needed for migraine (Not to exceed 2 doses in 24 hours). This is a 30 day rx 1 Inhaler 3  . SYMBICORT 160-4.5 MCG/ACT inhaler     . topiramate (TOPAMAX) 25 MG tablet TAKE 3 TABLETS BY MOUTH AT NIGHT 270 tablet 1  . valACYclovir (VALTREX) 500 MG tablet Take 500 mg by mouth daily.    . folic acid (FOLVITE) 1 MG tablet Take 1 mg by mouth daily.    [name] amLODipine (NORVASC) 5 MG tablet Take 5 mg by mouth daily.     . benazepril (LOTENSIN) 10 MG tablet Take 10 mg by mouth daily.     .  Fluticasone-Salmeterol (ADVAIR) 100-50 MCG/DOSE AEPB Inhale 1 puff into the lungs every 12 (twelve) hours.    [name] LORazepam (ATIVAN) 1 MG tablet Take 1 mg by mouth every 8 (eight) hours.    . Olopatadine HCl (PATADAY) 0.2 % SOLN Place 1 drop into both eyes daily.     No facility-administered medications prior to visit.    PAST MEDICAL HISTORY: Past Medical History:  Diagnosis Date  . Anxiety   . Arthritis   . Asthma    Advair as needed  . Back pain   . Common migraine with intractable migraine 12/24/2017  . DDD (degenerative disc disease)   . Full dentures   . GERD (gastroesophageal reflux disease)   . Hypertension   . Left wrist fracture   . Leg pain   . Nerve pain   . Wears glasses     PAST SURGICAL HISTORY: Past Surgical History:  Procedure Laterality Date  . ABDOMINAL HYSTERECTOMY  1981   one ovary/ tube removed  . BACK SURGERY  2005   L4, L5 fused  . Back surgery, 12/27/2012 L3 replaced cushion, L4/L5 fused  12/27/2012  . CARPAL TUNNEL RELEASE Left 08/04/2018   Procedure: OPEN LEFT CARPAL TUNNEL RELEASE;  Surgeon: Milly Jakob, MD;  Location: [place];  Service: Orthopedics;  Laterality: Left;  . COLONOSCOPY    . OPEN REDUCTION INTERNAL FIXATION (ORIF) DISTAL RADIAL FRACTURE Left 08/04/2018   Procedure: OPEN REDUCTION INTERNAL FIXATION (ORIF) LEFT DISTAL RADIAL FRACTURE;  Surgeon: Milly Jakob, MD;  Location: [place];  Service: Orthopedics;  Laterality: Left;  . SHOULDER ARTHROSCOPY WITH ROTATOR CUFF REPAIR AND SUBACROMIAL DECOMPRESSION Right 10/31/2013   Procedure: Right shoulder arthroscopy rotator cuff debridement, subacromial decompression, distal clavical excision;  Surgeon: Nita Sells, MD;  Location: [place];  Service: Orthopedics;  Laterality: Right;  Right shoulder arthroscopy rotator cuff debridement, subacromial decompression, distal clavical excision  . Thumb surgery- joint removed  2009    Arthritis-rt  . THYROID CYST EXCISION  2009  . TONSILLECTOMY      FAMILY HISTORY: Family History  Problem Relation Age of Onset  . Alzheimer's disease Father   . Parkinsonism Brother   . Migraines Daughter   . Cancer - Prostate Brother   . Breast cancer Neg Hx     SOCIAL HISTORY: Social History   Socioeconomic History  . Marital status: Divorced    Spouse name: Not on file  . Number of children: Not on file  . Years of education: Not on file  . Highest education level: Not on file  Occupational  History  . Not on file  Tobacco Use  . Smoking status: Former Smoker    Quit date: 10/27/2003    Years since quitting: 15.6  . Smokeless tobacco: Never Used  . Tobacco comment: Quit smoking 2007  Substance and Sexual Activity  . Alcohol use: No    Comment: stopped drinking 2009  . Drug use: No    Comment: past history marijauna 2013  . Sexual activity: Not Currently    Birth control/protection: Surgical  Other Topics Concern  . Not on file  Social History Narrative  . Not on file   Social Determinants of Health   Financial Resource Strain:   . Difficulty of Paying Living Expenses:   Food Insecurity:   . Worried About Charity fundraiser in the Last Year:   . Arboriculturist in the Last Year:   Transportation Needs:   . Film/video editor (Medical):   [name] Lack of Transportation (Non-Medical):   Physical Activity:   . Days of Exercise per Week:   . Minutes of Exercise per Session:   Stress:   . Feeling of Stress :   Social Connections:   . Frequency of Communication with Friends and Family:   . Frequency of Social Gatherings with Friends and Family:   . Attends Religious Services:   . Active Member of Clubs or Organizations:   . Attends Archivist Meetings:   [name] Marital Status:   Intimate Partner Violence:   . Fear of Current or Ex-Partner:   . Emotionally Abused:   [name] Physically Abused:   . Sexually Abused:       PHYSICAL EXAM  Vitals:    06/21/19 0726  BP: (!) 131/91  Pulse: 99  Temp: (!) 97.3 F (36.3 C)  Weight: 253 lb 12.8 oz (115.1 kg)  Height: '5\' 2"'  (1.575 m)   Body mass index is 46.42 kg/m.  Generalized: Well developed, in no acute distress   Neurological examination  Mentation: Alert oriented to time, place, history taking. Follows all commands speech and language fluent, somewhat hoarse  Cranial nerve II-XII: Pupils were equal round reactive to light. Extraocular movements were full, visual field were full on confrontational test. Facial sensation and strength were normal. Head turning and shoulder shrug were normal and symmetric. Motor: The motor testing reveals 5 over 5 strength of all 4 extremities. Good symmetric motor tone is noted throughout.  Sensory: Sensory testing is intact to soft touch on all 4 extremities. No evidence of extinction is noted.  Coordination: Cerebellar testing reveals good finger-nose-finger and heel-to-shin bilaterally.  Gait and station: Gait is normal. Tandem gait is slightly unsteady.  Romberg is negative. No drift is seen.  Reflexes: Deep tendon reflexes are symmetric   DIAGNOSTIC DATA (LABS, IMAGING, TESTING) - I reviewed patient records, labs, notes, testing and imaging myself where available.  Lab Results  Component Value Date   WBC 6.8 11/13/2013   HGB 11.4 (L) 11/13/2013   HCT 35.9 (L) 11/13/2013   MCV 85.3 11/13/2013   PLT 278 11/13/2013      Component Value Date/Time   NA 140 08/04/2018 0821   K 3.5 08/04/2018 0821   CL 103 08/04/2018 0821   CO2 23 08/04/2018 0821   GLUCOSE 164 (H) 08/04/2018 0821   BUN 10 08/04/2018 0821   CREATININE 0.83 08/04/2018 0821   CALCIUM 10.7 (H) 08/04/2018 0821   CALCIUM 10.4 03/16/2010 0057   PROT 7.3 02/21/2010 2259   ALBUMIN 4.4 02/21/2010  2259   AST 16 02/21/2010 2259   ALT 12 02/21/2010 2259   ALKPHOS 60 02/21/2010 2259   BILITOT 0.4 02/21/2010 2259   GFRNONAA >60 08/04/2018 0821   GFRAA >60 08/04/2018 0821   Lab  Results  Component Value Date   CHOL 152 02/21/2010   HDL 58 02/21/2010   LDLCALC 80 02/21/2010   TRIG 68 02/21/2010   CHOLHDL 2.6 Ratio 02/21/2010   Lab Results  Component Value Date   HGBA1C 5.5 05/07/2010   No results found for: VITAMINB12 Lab Results  Component Value Date   TSH 0.613 08/16/2007    ASSESSMENT AND PLAN 69 y.o. year old female  has a past medical history of Anxiety, Arthritis, Asthma, Back pain, Common migraine with intractable migraine (12/24/2017), DDD (degenerative disc disease), Full dentures, GERD (gastroesophageal reflux disease), Hypertension, Left wrist fracture, Leg pain, Nerve pain, and Wears glasses. here with:  1.  Headache  Her headaches remain under excellent control with Topamax and amitriptyline daily.  Her insurance would not cover Imitrex nasal spray.  She will use Tylenol for headache.  Previously, she has had elevated ESR, without clinical significance.  I was planning to check ESR, CRP today, but she is currently dealing with significant allergies, working with an allergist, the levels would likely be elevated.  I will see her back in 6 months.  I spent 15 minutes with the patient. 50% of this time was spent discussing her plan of care.  Butler Denmark, AGNP-C, DNP 06/21/2019, 7:41 AM [facility] [address] [place], [place] [address] [phone]

## 2019-06-21 ENCOUNTER — Ambulatory Visit (INDEPENDENT_AMBULATORY_CARE_PROVIDER_SITE_OTHER): Payer: Medicare HMO | Admitting: Neurology

## 2019-06-21 ENCOUNTER — Encounter: Payer: Self-pay | Admitting: Neurology

## 2019-06-21 ENCOUNTER — Other Ambulatory Visit: Payer: Self-pay

## 2019-06-21 VITALS — BP 131/91 | HR 99 | Temp 97.3°F | Ht 62.0 in | Wt 253.8 lb

## 2019-06-21 DIAGNOSIS — G43009 Migraine without aura, not intractable, without status migrainosus: Secondary | ICD-10-CM

## 2019-06-21 NOTE — Patient Instructions (Signed)
It was nice to see you today Continue current medications See you back in 6 months

## 2019-06-21 NOTE — Progress Notes (Signed)
I have read the note, and I agree with the clinical assessment and plan.  Fadil Macmaster K Maral Lampe   

## 2019-09-27 ENCOUNTER — Other Ambulatory Visit: Payer: Self-pay | Admitting: Neurology

## 2019-09-27 NOTE — Telephone Encounter (Signed)
Refilled x 3 months with note to pharmacy: must come in on 12/22/19 for follow up to receive more refills.

## 2019-10-25 ENCOUNTER — Other Ambulatory Visit: Payer: Medicare HMO

## 2019-11-17 ENCOUNTER — Other Ambulatory Visit: Payer: Self-pay | Admitting: Family

## 2019-11-17 DIAGNOSIS — Z1231 Encounter for screening mammogram for malignant neoplasm of breast: Secondary | ICD-10-CM

## 2019-11-24 ENCOUNTER — Other Ambulatory Visit: Payer: Self-pay

## 2019-12-21 NOTE — Progress Notes (Signed)
PATIENT: Pamela Dalton DOB: 11-Feb-1951  REASON FOR VISIT: follow up HISTORY FROM: patient  HISTORY OF PRESENT ILLNESS: Today 12/22/19 Pamela Dalton is a 69 year old female with history of headache syndrome.  She is on Topamax and Elavil at bedtime.  Was previously on Imitrex nasal spray, but insurance would not cover.  Headaches remain very well controlled, no longer has migraine, may have occasional twinge, but nothing significant.  She may take Tylenol or ibuprofen rarely.  She is seeing orthopedics, for tingling/numbness to the hands, having nerve conduction soon, needs double knee replacement.  Has chronic hoarseness, felt to be related to chronic acid reflux.  Is now living alone, at independent living community-she loves this.  She drives, uses a cane.  Is overall doing quite well.  Presents today unaccompanied.  Is not interested in dose reduction of migraine medications.  HISTORY  06/21/2019 SS:Pamela Dalton is a 69 year old female with history of headache syndrome.  She remains on Topamax and Elavil at bedtime.  She has previously used Imitrex nasal spray for acute relief, but insurance will no longer cover.  She has not had migraines since last seen.  If she has a headache she will take Tylenol or ibuprofen.  She is tolerating preventive medications without side effect.  She is currently having issues with allergies, has seen an allergist, fairly significant.  She also is having hoarseness, says it is felt that her acid reflux may have damaged her vocal cords.  She will be seeing ENT today.  She denies any other problems or concerns.  She has primary care at [facility].  She presents today for evaluation unaccompanied.  REVIEW OF SYSTEMS: Out of a complete 14 system review of symptoms, the patient complains only of the following symptoms, and all other reviewed systems are negative.  Headache  ALLERGIES: Allergies  Allergen Reactions   Codeine     REACTION: Rash and itching     Fentanyl Nausea And Vomiting    Had hard time waking up    HOME MEDICATIONS: Outpatient Medications Prior to Visit  Medication Sig Dispense Refill   gabapentin (NEURONTIN) 300 MG capsule Take 300 mg by mouth 3 (three) times daily.     traMADol (ULTRAM) 50 MG tablet Take by mouth every 6 (six) hours as needed.     acetaminophen (TYLENOL) 325 MG tablet Take 2 tablets (650 mg total) by mouth every 6 (six) hours.     albuterol (VENTOLIN HFA) 108 (90 Base) MCG/ACT inhaler Inhale into the lungs every 6 (six) hours as needed for wheezing or shortness of breath.     amLODipine (NORVASC) 10 MG tablet Take 10 mg by mouth daily.     aspirin 81 MG tablet Take 81 mg by mouth daily.     azelastine (OPTIVAR) 0.05 % ophthalmic solution 1 drop 2 (two) times daily.     cetirizine (ZYRTEC) 10 MG tablet Take 10 mg by mouth daily.     Cholecalciferol (VITAMIN D3 PO) Take 1,000 Units by mouth daily.     esomeprazole (NEXIUM) 20 MG capsule Take 20 mg by mouth daily as needed (acid reflux).      ibuprofen (ADVIL) 200 MG tablet Take 3 tablets (600 mg total) by mouth every 6 (six) hours.     losartan-hydrochlorothiazide (HYZAAR) 100-25 MG per tablet Take 1 tablet by mouth daily.     mirabegron ER (MYRBETRIQ) 50 MG TB24 tablet Take 50 mg by mouth daily.     montelukast (SINGULAIR) 10 MG  tablet Take 10 mg by mouth at bedtime.      oxybutynin (DITROPAN) 5 MG tablet Take 1 tablet (5 mg total) by mouth 3 (three) times daily. 90 tablet 3   SUMAtriptan (IMITREX) 5 MG/ACT nasal spray Place 1 spray (5 mg total) into the nose every 2 (two) hours as needed for migraine (Not to exceed 2 doses in 24 hours). This is a 30 day rx 1 Inhaler 3   SYMBICORT 160-4.5 MCG/ACT inhaler      valACYclovir (VALTREX) 500 MG tablet Take 500 mg by mouth daily.     amitriptyline (ELAVIL) 25 MG tablet TAKE 3 TABLETS(75 MG) BY MOUTH AT BEDTIME 270 tablet 1   topiramate (TOPAMAX) 25 MG tablet TAKE 3 TABLETS AT NIGHT (PLEASE  CALL FOR APPOINTMENT [phone]) 270 tablet 0   No facility-administered medications prior to visit.    PAST MEDICAL HISTORY: Past Medical History:  Diagnosis Date   Anxiety    Arthritis    Asthma    Advair as needed   Back pain    Common migraine with intractable migraine 12/24/2017   DDD (degenerative disc disease)    Full dentures    GERD (gastroesophageal reflux disease)    Hypertension    Left wrist fracture    Leg pain    Nerve pain    Wears glasses     PAST SURGICAL HISTORY: Past Surgical History:  Procedure Laterality Date   ABDOMINAL HYSTERECTOMY  1981   one ovary/ tube removed   BACK SURGERY  2005   L4, L5 fused   Back surgery, 12/27/2012 L3 replaced cushion, L4/L5 fused  12/27/2012   CARPAL TUNNEL RELEASE Left 08/04/2018   Procedure: OPEN LEFT CARPAL TUNNEL RELEASE;  Surgeon: Milly Jakob, MD;  Location: [place];  Service: Orthopedics;  Laterality: Left;   COLONOSCOPY     OPEN REDUCTION INTERNAL FIXATION (ORIF) DISTAL RADIAL FRACTURE Left 08/04/2018   Procedure: OPEN REDUCTION INTERNAL FIXATION (ORIF) LEFT DISTAL RADIAL FRACTURE;  Surgeon: Milly Jakob, MD;  Location: [place];  Service: Orthopedics;  Laterality: Left;   SHOULDER ARTHROSCOPY WITH ROTATOR CUFF REPAIR AND SUBACROMIAL DECOMPRESSION Right 10/31/2013   Procedure: Right shoulder arthroscopy rotator cuff debridement, subacromial decompression, distal clavical excision;  Surgeon: Nita Sells, MD;  Location: [place];  Service: Orthopedics;  Laterality: Right;  Right shoulder arthroscopy rotator cuff debridement, subacromial decompression, distal clavical excision   Thumb surgery- joint removed  2009   Arthritis-rt   THYROID CYST EXCISION  2009   TONSILLECTOMY      FAMILY HISTORY: Family History  Problem Relation Age of Onset   Alzheimer's disease Father    Parkinsonism Brother    Migraines Daughter     Cancer - Prostate Brother    Breast cancer Neg Hx     SOCIAL HISTORY: Social History   Socioeconomic History   Marital status: Divorced    Spouse name: Not on file   Number of children: Not on file   Years of education: Not on file   Highest education level: Not on file  Occupational History   Not on file  Tobacco Use   Smoking status: Former Smoker    Quit date: 10/27/2003    Years since quitting: 16.1   Smokeless tobacco: Never Used   Tobacco comment: Quit smoking 2007  Substance and Sexual Activity   Alcohol use: No    Comment: stopped drinking 2009   Drug use: No    Comment: past  history marijauna 2013   Sexual activity: Not Currently    Birth control/protection: Surgical  Other Topics Concern   Not on file  Social History Narrative   Not on file   Social Determinants of Health   Financial Resource Strain:    Difficulty of Paying Living Expenses: Not on file  Food Insecurity:    Worried About [place] in the Last Year: Not on file   Ran Out of Food in the Last Year: Not on file  Transportation Needs:    Lack of Transportation (Medical): Not on file   Lack of Transportation (Non-Medical): Not on file  Physical Activity:    Days of Exercise per Week: Not on file   Minutes of Exercise per Session: Not on file  Stress:    Feeling of Stress : Not on file  Social Connections:    Frequency of Communication with Friends and Family: Not on file   Frequency of Social Gatherings with Friends and Family: Not on file   Attends Religious Services: Not on file   Active Member of Clubs or Organizations: Not on file   Attends Archivist Meetings: Not on file   Marital Status: Not on file  Intimate Partner Violence:    Fear of Current or Ex-Partner: Not on file   Emotionally Abused: Not on file   Physically Abused: Not on file   Sexually Abused: Not on file   PHYSICAL EXAM  Vitals:   12/22/19 0817 12/22/19 0818    BP: (!) 145/95 (!) 151/88  Pulse: 94 93  Weight: 233 lb (105.7 kg)   Height: 5\' 2"  (1.575 m)    Body mass index is 42.62 kg/m.  Generalized: Well developed, in no acute distress   Neurological examination  Mentation: Alert oriented to time, place, history taking. Follows all commands speech and language fluent Cranial nerve II-XII: Pupils were equal round reactive to light. Extraocular movements were full, visual field were full on confrontational test. Facial sensation and strength were normal. Head turning and shoulder shrug  were normal and symmetric. Motor: Good strength of all extremities Sensory: Sensory testing is intact to soft touch on all 4 extremities. No evidence of extinction is noted.  Coordination: Cerebellar testing reveals good finger-nose-finger and heel-to-shin bilaterally.  Gait and station: Gait is slightly wide-based, uses single-point cane. Reflexes: Deep tendon reflexes are symmetric and normal bilaterally.   DIAGNOSTIC DATA (LABS, IMAGING, TESTING) - I reviewed patient records, labs, notes, testing and imaging myself where available.  Lab Results  Component Value Date   WBC 6.8 11/13/2013   HGB 11.4 (L) 11/13/2013   HCT 35.9 (L) 11/13/2013   MCV 85.3 11/13/2013   PLT 278 11/13/2013      Component Value Date/Time   NA 140 08/04/2018 0821   K 3.5 08/04/2018 0821   CL 103 08/04/2018 0821   CO2 23 08/04/2018 0821   GLUCOSE 164 (H) 08/04/2018 0821   BUN 10 08/04/2018 0821   CREATININE 0.83 08/04/2018 0821   CALCIUM 10.7 (H) 08/04/2018 0821   CALCIUM 10.4 03/16/2010 0057   PROT 7.3 02/21/2010 2259   ALBUMIN 4.4 02/21/2010 2259   AST 16 02/21/2010 2259   ALT 12 02/21/2010 2259   ALKPHOS 60 02/21/2010 2259   BILITOT 0.4 02/21/2010 2259   GFRNONAA >60 08/04/2018 0821   GFRAA >60 08/04/2018 0821   Lab Results  Component Value Date   CHOL 152 02/21/2010   HDL 58 02/21/2010   LDLCALC 80 02/21/2010  TRIG 68 02/21/2010   CHOLHDL 2.6 Ratio  02/21/2010   Lab Results  Component Value Date   HGBA1C 5.5 05/07/2010   No results found for: VITAMINB12 Lab Results  Component Value Date   TSH 0.613 08/16/2007    ASSESSMENT AND PLAN 69 y.o. year old female  has a past medical history of Anxiety, Arthritis, Asthma, Back pain, Common migraine with intractable migraine (12/24/2017), DDD (degenerative disc disease), Full dentures, GERD (gastroesophageal reflux disease), Hypertension, Left wrist fracture, Leg pain, Nerve pain, and Wears glasses. here with:  1.  Headache syndrome  -Headaches remain under excellent control -We will continue Topamax and amitriptyline -Not interested in dose reduction -Continue follow-up with PCP, seeing orthopedist for upcoming NCV possible neuropathy? -Follow-up in 1 year or sooner if needed  I spent 20 minutes of face-to-face and non-face-to-face time with patient.  This included previsit chart review, lab review, study review, order entry, electronic health record documentation, patient education.  Butler Denmark, AGNP-C, DNP 12/22/2019, 8:58 AM [facility] [address] [place], [place] [address] [phone]

## 2019-12-22 ENCOUNTER — Other Ambulatory Visit: Payer: Self-pay

## 2019-12-22 ENCOUNTER — Encounter: Payer: Self-pay | Admitting: Neurology

## 2019-12-22 ENCOUNTER — Ambulatory Visit (INDEPENDENT_AMBULATORY_CARE_PROVIDER_SITE_OTHER): Payer: Medicare HMO | Admitting: Neurology

## 2019-12-22 VITALS — BP 151/88 | HR 93 | Ht 62.0 in | Wt 233.0 lb

## 2019-12-22 DIAGNOSIS — G43019 Migraine without aura, intractable, without status migrainosus: Secondary | ICD-10-CM

## 2019-12-22 MED ORDER — TOPIRAMATE 25 MG PO TABS: ORAL_TABLET | ORAL | 4 refills | Status: DC

## 2019-12-22 MED ORDER — AMITRIPTYLINE HCL 25 MG PO TABS: ORAL_TABLET | ORAL | 4 refills | Status: AC

## 2019-12-22 NOTE — Patient Instructions (Signed)
I think you are doing well! Continue current medications  refills sent See you back in 1 year

## 2019-12-22 NOTE — Progress Notes (Signed)
I have read the note, and I agree with the clinical assessment and plan.  Odella Appelhans K Maudy Yonan   

## 2019-12-23 ENCOUNTER — Ambulatory Visit
Admission: RE | Admit: 2019-12-23 | Discharge: 2019-12-23 | Disposition: A | Discharge status: Home / Self Care | Payer: Medicare HMO | Source: Ambulatory Visit | Attending: Family | Admitting: Family

## 2019-12-23 ENCOUNTER — Other Ambulatory Visit: Payer: Self-pay

## 2019-12-23 DIAGNOSIS — Z1231 Encounter for screening mammogram for malignant neoplasm of breast: Secondary | ICD-10-CM

## 2019-12-27 ENCOUNTER — Other Ambulatory Visit: Payer: Self-pay | Admitting: Family

## 2019-12-27 DIAGNOSIS — R928 Other abnormal and inconclusive findings on diagnostic imaging of breast: Secondary | ICD-10-CM

## 2020-01-02 ENCOUNTER — Other Ambulatory Visit: Payer: Self-pay

## 2020-01-02 ENCOUNTER — Ambulatory Visit
Admission: RE | Admit: 2020-01-02 | Discharge: 2020-01-02 | Disposition: A | Discharge status: Home / Self Care | Payer: Medicare HMO | Source: Ambulatory Visit | Attending: Family | Admitting: Family

## 2020-01-02 DIAGNOSIS — R928 Other abnormal and inconclusive findings on diagnostic imaging of breast: Secondary | ICD-10-CM

## 2020-01-24 ENCOUNTER — Other Ambulatory Visit: Payer: Self-pay

## 2020-01-24 ENCOUNTER — Ambulatory Visit
Admission: RE | Admit: 2020-01-24 | Discharge: 2020-01-24 | Disposition: A | Discharge status: Home / Self Care | Payer: Medicare HMO | Source: Ambulatory Visit | Attending: Family | Admitting: Family

## 2020-01-24 DIAGNOSIS — Z13820 Encounter for screening for osteoporosis: Secondary | ICD-10-CM

## 2020-01-29 ENCOUNTER — Encounter (HOSPITAL_COMMUNITY): Payer: Self-pay | Admitting: Emergency Medicine

## 2020-01-29 ENCOUNTER — Ambulatory Visit (HOSPITAL_COMMUNITY)
Admission: EM | Admit: 2020-01-29 | Discharge: 2020-01-29 | Disposition: A | Discharge status: Nursing Home (Includes ICF) | Payer: Medicare HMO | Attending: Emergency Medicine | Admitting: Emergency Medicine

## 2020-01-29 ENCOUNTER — Emergency Department (HOSPITAL_COMMUNITY)
Admission: EM | Admit: 2020-01-29 | Discharge: 2020-01-29 | Disposition: A | Discharge status: Home / Self Care | Payer: Medicare HMO | Attending: Emergency Medicine | Admitting: Emergency Medicine

## 2020-01-29 ENCOUNTER — Other Ambulatory Visit: Payer: Self-pay

## 2020-01-29 ENCOUNTER — Ambulatory Visit (HOSPITAL_COMMUNITY): Payer: Medicare HMO

## 2020-01-29 ENCOUNTER — Emergency Department (HOSPITAL_COMMUNITY): Payer: Medicare HMO

## 2020-01-29 DIAGNOSIS — E1140 Type 2 diabetes mellitus with diabetic neuropathy, unspecified: Secondary | ICD-10-CM | POA: Insufficient documentation

## 2020-01-29 DIAGNOSIS — R042 Hemoptysis: Secondary | ICD-10-CM | POA: Diagnosis not present

## 2020-01-29 DIAGNOSIS — R519 Headache, unspecified: Secondary | ICD-10-CM | POA: Diagnosis not present

## 2020-01-29 DIAGNOSIS — Y92410 Unspecified street and highway as the place of occurrence of the external cause: Secondary | ICD-10-CM | POA: Diagnosis not present

## 2020-01-29 DIAGNOSIS — Z7982 Long term (current) use of aspirin: Secondary | ICD-10-CM | POA: Insufficient documentation

## 2020-01-29 DIAGNOSIS — Z87891 Personal history of nicotine dependence: Secondary | ICD-10-CM | POA: Insufficient documentation

## 2020-01-29 DIAGNOSIS — E1165 Type 2 diabetes mellitus with hyperglycemia: Secondary | ICD-10-CM | POA: Insufficient documentation

## 2020-01-29 DIAGNOSIS — M549 Dorsalgia, unspecified: Secondary | ICD-10-CM

## 2020-01-29 DIAGNOSIS — Z7951 Long term (current) use of inhaled steroids: Secondary | ICD-10-CM | POA: Insufficient documentation

## 2020-01-29 DIAGNOSIS — S2020XA Contusion of thorax, unspecified, initial encounter: Secondary | ICD-10-CM

## 2020-01-29 DIAGNOSIS — I10 Essential (primary) hypertension: Secondary | ICD-10-CM | POA: Insufficient documentation

## 2020-01-29 DIAGNOSIS — R0789 Other chest pain: Secondary | ICD-10-CM | POA: Diagnosis not present

## 2020-01-29 DIAGNOSIS — R0602 Shortness of breath: Secondary | ICD-10-CM

## 2020-01-29 DIAGNOSIS — R000 Tachycardia, unspecified: Secondary | ICD-10-CM | POA: Diagnosis not present

## 2020-01-29 DIAGNOSIS — R9389 Abnormal findings on diagnostic imaging of other specified body structures: Secondary | ICD-10-CM

## 2020-01-29 DIAGNOSIS — Z79899 Other long term (current) drug therapy: Secondary | ICD-10-CM | POA: Insufficient documentation

## 2020-01-29 DIAGNOSIS — J45909 Unspecified asthma, uncomplicated: Secondary | ICD-10-CM | POA: Diagnosis not present

## 2020-01-29 DIAGNOSIS — M546 Pain in thoracic spine: Secondary | ICD-10-CM | POA: Insufficient documentation

## 2020-01-29 DIAGNOSIS — R059 Cough, unspecified: Secondary | ICD-10-CM | POA: Insufficient documentation

## 2020-01-29 DIAGNOSIS — R0600 Dyspnea, unspecified: Secondary | ICD-10-CM | POA: Diagnosis not present

## 2020-01-29 LAB — CBC WITH DIFFERENTIAL/PLATELET
Abs Immature Granulocytes: 0.01 10*3/uL (ref 0.00–0.07)
Basophils Absolute: 0 10*3/uL (ref 0.0–0.1)
Basophils Relative: 0 %
Eosinophils Absolute: 0 10*3/uL (ref 0.0–0.5)
Eosinophils Relative: 0 %
HCT: 43.1 % (ref 36.0–46.0)
Hemoglobin: 13.5 g/dL (ref 12.0–15.0)
Immature Granulocytes: 0 %
Lymphocytes Relative: 31 %
Lymphs Abs: 1.6 10*3/uL (ref 0.7–4.0)
MCH: 27.2 pg (ref 26.0–34.0)
MCHC: 31.3 g/dL (ref 30.0–36.0)
MCV: 86.9 fL (ref 80.0–100.0)
Monocytes Absolute: 0.5 10*3/uL (ref 0.1–1.0)
Monocytes Relative: 10 %
Neutro Abs: 3 10*3/uL (ref 1.7–7.7)
Neutrophils Relative %: 59 %
Platelets: 161 10*3/uL (ref 150–400)
RBC: 4.96 MIL/uL (ref 3.87–5.11)
RDW: 16.5 % — ABNORMAL HIGH (ref 11.5–15.5)
WBC: 5.2 10*3/uL (ref 4.0–10.5)
nRBC: 0 % (ref 0.0–0.2)

## 2020-01-29 LAB — I-STAT CHEM 8, ED
BUN: 11 mg/dL (ref 8–23)
Calcium, Ion: 1.29 mmol/L (ref 1.15–1.40)
Chloride: 105 mmol/L (ref 98–111)
Creatinine, Ser: 0.6 mg/dL (ref 0.44–1.00)
Glucose, Bld: 117 mg/dL — ABNORMAL HIGH (ref 70–99)
HCT: 43 % (ref 36.0–46.0)
Hemoglobin: 14.6 g/dL (ref 12.0–15.0)
Potassium: 3.4 mmol/L — ABNORMAL LOW (ref 3.5–5.1)
Sodium: 141 mmol/L (ref 135–145)
TCO2: 21 mmol/L — ABNORMAL LOW (ref 22–32)

## 2020-01-29 MED ORDER — MORPHINE SULFATE (PF) 4 MG/ML IV SOLN
4.0000 mg | Freq: Once | INTRAVENOUS | Status: AC
  Administered 2020-01-29: 4 mg via INTRAVENOUS
  Filled 2020-01-29: qty 1

## 2020-01-29 MED ORDER — IOHEXOL 300 MG/ML  SOLN
75.0000 mL | Freq: Once | INTRAMUSCULAR | Status: AC | PRN
  Administered 2020-01-29: 75 mL via INTRAVENOUS
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
## 2020-01-29 NOTE — ED Notes (Signed)
TC to [facility] [place] to give report Pt is coming by [facility]
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
## 2020-02-09 ENCOUNTER — Other Ambulatory Visit: Payer: Self-pay

## 2020-02-09 ENCOUNTER — Emergency Department (HOSPITAL_COMMUNITY)
Admission: EM | Admit: 2020-02-09 | Discharge: 2020-02-09 | Disposition: A | Discharge status: Home / Self Care | Payer: Medicare HMO | Attending: Emergency Medicine | Admitting: Emergency Medicine

## 2020-02-09 ENCOUNTER — Emergency Department (HOSPITAL_COMMUNITY): Payer: Medicare HMO

## 2020-02-09 DIAGNOSIS — Z20822 Contact with and (suspected) exposure to covid-19: Secondary | ICD-10-CM | POA: Diagnosis not present

## 2020-02-09 DIAGNOSIS — N132 Hydronephrosis with renal and ureteral calculous obstruction: Secondary | ICD-10-CM | POA: Insufficient documentation

## 2020-02-09 DIAGNOSIS — I10 Essential (primary) hypertension: Secondary | ICD-10-CM | POA: Insufficient documentation

## 2020-02-09 DIAGNOSIS — Z7982 Long term (current) use of aspirin: Secondary | ICD-10-CM | POA: Insufficient documentation

## 2020-02-09 DIAGNOSIS — Z79899 Other long term (current) drug therapy: Secondary | ICD-10-CM | POA: Diagnosis not present

## 2020-02-09 DIAGNOSIS — J45909 Unspecified asthma, uncomplicated: Secondary | ICD-10-CM | POA: Diagnosis not present

## 2020-02-09 DIAGNOSIS — N200 Calculus of kidney: Secondary | ICD-10-CM

## 2020-02-09 DIAGNOSIS — R1032 Left lower quadrant pain: Secondary | ICD-10-CM | POA: Diagnosis present

## 2020-02-09 DIAGNOSIS — E119 Type 2 diabetes mellitus without complications: Secondary | ICD-10-CM | POA: Insufficient documentation

## 2020-02-09 DIAGNOSIS — Z7951 Long term (current) use of inhaled steroids: Secondary | ICD-10-CM | POA: Diagnosis not present

## 2020-02-09 DIAGNOSIS — K219 Gastro-esophageal reflux disease without esophagitis: Secondary | ICD-10-CM | POA: Diagnosis not present

## 2020-02-09 DIAGNOSIS — Z87891 Personal history of nicotine dependence: Secondary | ICD-10-CM | POA: Insufficient documentation

## 2020-02-09 LAB — URINALYSIS, ROUTINE W REFLEX MICROSCOPIC
Bilirubin Urine: NEGATIVE
Glucose, UA: 50 mg/dL — AB
Hgb urine dipstick: NEGATIVE
Ketones, ur: NEGATIVE mg/dL
Leukocytes,Ua: NEGATIVE
Nitrite: NEGATIVE
Protein, ur: NEGATIVE mg/dL
Specific Gravity, Urine: 1.023 (ref 1.005–1.030)
pH: 8 (ref 5.0–8.0)

## 2020-02-09 LAB — CBC
HCT: 46 % (ref 36.0–46.0)
Hemoglobin: 14.5 g/dL (ref 12.0–15.0)
MCH: 26.9 pg (ref 26.0–34.0)
MCHC: 31.5 g/dL (ref 30.0–36.0)
MCV: 85.2 fL (ref 80.0–100.0)
Platelets: 430 10*3/uL — ABNORMAL HIGH (ref 150–400)
RBC: 5.4 MIL/uL — ABNORMAL HIGH (ref 3.87–5.11)
RDW: 15.4 % (ref 11.5–15.5)
WBC: 7.8 10*3/uL (ref 4.0–10.5)
nRBC: 0 % (ref 0.0–0.2)

## 2020-02-09 LAB — COMPREHENSIVE METABOLIC PANEL
ALT: 21 U/L (ref 0–44)
AST: 25 U/L (ref 15–41)
Albumin: 4.1 g/dL (ref 3.5–5.0)
Alkaline Phosphatase: 68 U/L (ref 38–126)
Anion gap: 15 (ref 5–15)
BUN: 10 mg/dL (ref 8–23)
CO2: 24 mmol/L (ref 22–32)
Calcium: 10.7 mg/dL — ABNORMAL HIGH (ref 8.9–10.3)
Chloride: 103 mmol/L (ref 98–111)
Creatinine, Ser: 0.94 mg/dL (ref 0.44–1.00)
GFR, Estimated: >60 mL/min (ref >60)
Glucose, Bld: 159 mg/dL — ABNORMAL HIGH (ref 70–99)
Potassium: 3 mmol/L — ABNORMAL LOW (ref 3.5–5.1)
Sodium: 142 mmol/L (ref 135–145)
Total Bilirubin: 0.6 mg/dL (ref 0.3–1.2)
Total Protein: 8.1 g/dL (ref 6.5–8.1)

## 2020-02-09 LAB — RESPIRATORY PANEL BY RT PCR (FLU A&B, COVID)
Influenza A by PCR: NEGATIVE
Influenza B by PCR: NEGATIVE
SARS Coronavirus 2 by RT PCR: NEGATIVE

## 2020-02-09 LAB — TSH: TSH: 0.351 u[IU]/mL (ref 0.350–4.500)

## 2020-02-09 LAB — LIPASE, BLOOD: Lipase: 32 U/L (ref 11–51)

## 2020-02-09 MED ORDER — IOHEXOL 300 MG/ML  SOLN
100.0000 mL | Freq: Once | INTRAMUSCULAR | Status: AC | PRN
  Administered 2020-02-09: 100 mL via INTRAVENOUS

## 2020-02-09 MED ORDER — ONDANSETRON 4 MG PO TBDP: 4.0000 mg | ORAL_TABLET | Freq: Three times a day (TID) | ORAL | 0 refills | Status: AC | PRN

## 2020-02-09 MED ORDER — HYDROMORPHONE HCL 1 MG/ML IJ SOLN
0.5000 mg | Freq: Once | INTRAMUSCULAR | Status: AC
  Administered 2020-02-09: 0.5 mg via INTRAVENOUS
  Filled 2020-02-09: qty 1

## 2020-02-09 MED ORDER — TAMSULOSIN HCL 0.4 MG PO CAPS: 0.4000 mg | ORAL_CAPSULE | Freq: Every day | ORAL | 0 refills | Status: AC

## 2020-02-09 MED ORDER — SODIUM CHLORIDE 0.9 % IV BOLUS
1000.0000 mL | Freq: Once | INTRAVENOUS | Status: AC
  Administered 2020-02-09: 1000 mL via INTRAVENOUS

## 2020-02-09 MED ORDER — ONDANSETRON HCL 4 MG/2ML IJ SOLN
4.0000 mg | Freq: Once | INTRAMUSCULAR | Status: AC
  Administered 2020-02-09: 4 mg via INTRAVENOUS
  Filled 2020-02-09: qty 2

## 2020-02-09 MED ORDER — POTASSIUM CHLORIDE 10 MEQ/100ML IV SOLN
10.0000 meq | Freq: Once | INTRAVENOUS | Status: AC
  Administered 2020-02-09: 10 meq via INTRAVENOUS
  Filled 2020-02-09: qty 100

## 2020-02-09 MED ORDER — POTASSIUM CHLORIDE CRYS ER 20 MEQ PO TBCR
40.0000 meq | EXTENDED_RELEASE_TABLET | Freq: Once | ORAL | Status: AC
  Administered 2020-02-09: 40 meq via ORAL
  Filled 2020-02-09: qty 2

## 2020-02-09 MED ORDER — HYDROCODONE-ACETAMINOPHEN 5-325 MG PO TABS
1.0000 | ORAL_TABLET | Freq: Four times a day (QID) | ORAL | 0 refills | Status: AC | PRN
Start: 2020-02-09 — End: ?

## 2020-02-09 NOTE — ED Notes (Signed)
Patient transported to CT 
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
## 2020-02-09 NOTE — ED Notes (Signed)
Pt ambulated to bathroom with no issue.
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
## 2020-02-09 NOTE — Discharge Instructions (Signed)
You were seen in the emergency department and found to have a 4 mm kidney stone.  We are sending you home with multiple medications to assist with passing the stone:   -Flomax-this is a medication to help pass the stone, it allows urine to exit the body more freely.  Please take this once daily with a meal.   -Norco: Do NOT take tramadol at the same time as the Norco this is a narcotic/controlled substance medication that has potential addicting qualities.  We recommend that you take 1-2 tablets every 6 hours as needed for severe pain.  Do not drive or operate heavy machinery when taking this medicine as it can be sedating. Do not drink alcohol or take other sedating medications when taking this medicine for safety reasons.  Keep this out of reach of small children.  Please be aware this medicine has Tylenol in it (325 mg/tab) do not exceed the maximum dose of Tylenol in a day per over the counter recommendations should you decide to supplement with Tylenol over the counter.   -Zofran-this is an antinausea medication, you may take this every 8 hours as needed for nausea and vomiting, please allow the tablet to dissolve underneath of your tongue.   We have prescribed you new medication(s) today. Discuss the medications prescribed today with your pharmacist as they can have adverse effects and interactions with your other medicines including over the counter and prescribed medications. Seek medical evaluation if you start to experience new or abnormal symptoms after taking one of these medicines, seek care immediately if you start to experience difficulty breathing, feeling of your throat closing, facial swelling, or rash as these could be indications of a more serious allergic reaction  Please follow-up with the urology group provided in your discharge instructions within 3 to 5 days.  Return to the ER for new or worsening symptoms including but not limited to worsening pain not controlled by these  medicines, inability to keep fluids down, fever, or any other concerns that you may have.
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
## 2020-02-13 ENCOUNTER — Other Ambulatory Visit: Payer: Self-pay | Admitting: Family

## 2020-02-13 DIAGNOSIS — E2839 Other primary ovarian failure: Secondary | ICD-10-CM

## 2020-03-29 ENCOUNTER — Other Ambulatory Visit: Payer: Self-pay

## 2020-03-29 ENCOUNTER — Encounter: Payer: Self-pay | Admitting: Orthopaedic Surgery

## 2020-03-29 ENCOUNTER — Ambulatory Visit (INDEPENDENT_AMBULATORY_CARE_PROVIDER_SITE_OTHER): Payer: Medicare HMO | Admitting: Orthopaedic Surgery

## 2020-03-29 ENCOUNTER — Telehealth: Payer: Self-pay | Admitting: Orthopaedic Surgery

## 2020-03-29 VITALS — Ht 64.0 in | Wt 212.0 lb

## 2020-03-29 DIAGNOSIS — G5603 Carpal tunnel syndrome, bilateral upper limbs: Secondary | ICD-10-CM

## 2020-03-29 DIAGNOSIS — G5601 Carpal tunnel syndrome, right upper limb: Secondary | ICD-10-CM

## 2020-03-29 DIAGNOSIS — G5602 Carpal tunnel syndrome, left upper limb: Secondary | ICD-10-CM

## 2020-03-29 NOTE — Progress Notes (Signed)
Office Visit Note   Patient: Pamela Dalton           Date of Birth: 1950/12/10           MRN: [id number] Visit Date: 03/29/2020              Requested by: Sonia Side., FNP [place],  [place] [id number] PCP: Sonia Side., FNP   Assessment & Plan: Visit Diagnoses:  1. Left carpal tunnel syndrome   2. Right carpal tunnel syndrome     Plan: Impression is recurrent left greater than right carpal tunnel syndrome.  We will have her sign a medical release form so that we can look at the nerve conduction studies were done at [facility] so that we can formulate a treatment plan.  I will reach out to the patient once we have that information.  Follow-Up Instructions: Return if symptoms worsen or fail to improve.   Orders:  No orders of the defined types were placed in this encounter.  No orders of the defined types were placed in this encounter.     Procedures: No procedures performed   Clinical Data: No additional findings.   Subjective: Chief Complaint  Patient presents with  . Right Hand - Pain  . Left Hand - Pain    Pamela Dalton is a very pleasant 69 year old female here for evaluation of bilateral carpal tunnel syndrome worse on the left.  She is a referral from PCP.  Briefly she underwent ORIF of a left distal radius fracture in May 2020 by Dr. Grandville Silos at [facility].  She also had a carpal tunnel release at this time.  She states that recently she has been getting recurrence of the carpal tunnel symptoms.  She feels like the entire left hand is numb and the right hand is numb in the thumb index and long fingers.  She wears a brace at night on the left hand which helps partially.  She did have nerve conduction studies by Dr. Mina Marble at [facility] which showed recurrent carpal tunnel syndrome.   Review of Systems  Constitutional: Negative.   HENT: Negative.   Eyes: Negative.   Respiratory: Negative.   Cardiovascular:  Negative.   Endocrine: Negative.   Musculoskeletal: Negative.   Neurological: Negative.   Hematological: Negative.   Psychiatric/Behavioral: Negative.   All other systems reviewed and are negative.    Objective: Vital Signs: Ht 5\' 4"  (1.626 m)   Wt 212 lb (96.2 kg)   BMI 36.39 kg/m   Physical Exam Vitals and nursing note reviewed.  Constitutional:      Appearance: She is well-developed and well-nourished.  HENT:     Head: Normocephalic and atraumatic.  Eyes:     Extraocular Movements: EOM normal.  Pulmonary:     Effort: Pulmonary effort is normal.  Abdominal:     Palpations: Abdomen is soft.  Musculoskeletal:     Cervical back: Neck supple.  Skin:    General: Skin is warm.     Capillary Refill: Capillary refill takes less than 2 seconds.  Neurological:     Mental Status: She is alert and oriented to person, place, and time.  Psychiatric:        Mood and Affect: Mood and affect normal.        Behavior: Behavior normal.        Thought Content: Thought content normal.        Judgment: Judgment normal.  Ortho Exam Left hand and wrist shows fully healed surgical scar on the volar side from previous distal radius ORIF.  No neurovascular compromise.  Positive carpal tunnel compressive signs.  Mild thenar atrophy. Right hand shows positive carpal tunnel compressive signs.  No neurovascular compromise.  Mild thenar atrophy.  Specialty Comments:  No specialty comments available.  Imaging: No results found.   PMFS History: Patient Active Problem List   Diagnosis Date Noted  . Common migraine 03/24/2018  . Common migraine with intractable migraine 12/24/2017  . SINUSITIS, ACUTE 05/07/2010  . UTI 12/27/2009  . VAGINITIS, BACTERIAL 12/07/2009  . URINALYSIS, ABNORMAL 12/07/2009  . URI 05/09/2009  . COLONIC POLYPS 02/01/2009  . HEMORRHOIDS, INTERNAL 02/01/2009  . DIVERTICULOSIS OF COLON 02/01/2009  . ALLERGIC ASTHMA 01/02/2009  . PELVIC PAIN, LEFT 01/02/2009  .  ONYCHOMYCOSIS 08/01/2008  . LEG CRAMPS 08/01/2008  . DIABETES MELLITUS, TYPE II, CONTROLLED 10/07/2007  . PLANTAR FASCIITIS, BILATERAL 10/07/2007  . PERIPHERAL EDEMA 09/21/2007  . OBESITY 08/16/2007  . SINUSITIS 08/16/2007  . PERIPHERAL NEUROPATHY 07/27/2007  . ALLERGIC RHINITIS 07/27/2007  . GERD 07/27/2007  . CARPAL TUNNEL SYNDROME, RIGHT 05/27/2007  . SHOULDER PAIN, RIGHT 05/27/2007  . HYPERPARATHYROIDISM, PRIMARY 01/10/2007  . BRONCHITIS, ACUTE 12/24/2006  . DYSLIPIDEMIA 11/22/2006  . DISORDER, 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  . DISORDER, TOBACCO USE 11/22/2006  . HYPERTENSION, BENIGN ESSENTIAL 11/22/2006  . URINARY INCONTINENCE, 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  . PROTEINURIA 11/22/2006  . HYPERCALCEMIA 10/06/2006   Past Medical History:  Diagnosis Date  . Anxiety   . Arthritis   . Asthma    Advair as needed  . Back pain   . Common migraine with intractable migraine 12/24/2017  . DDD (degenerative disc disease)   . Full dentures   . GERD (gastroesophageal reflux disease)   . Hypertension   . Left wrist fracture   . Leg pain   . Nerve pain   . Wears glasses     Family History  Problem Relation Age of Onset  . Alzheimer's disease Father   . Parkinsonism Brother   . Migraines Daughter   . Cancer - Prostate Brother   . Breast cancer Neg Hx     Past Surgical History:  Procedure Laterality Date  . ABDOMINAL HYSTERECTOMY  1981   one ovary/ tube removed  . BACK SURGERY  2005   L4, L5 fused  . Back surgery, 12/27/2012 L3 replaced cushion, L4/L5 fused  12/27/2012  . CARPAL TUNNEL RELEASE Left 08/04/2018   Procedure: OPEN LEFT CARPAL TUNNEL RELEASE;  Surgeon: Milly Jakob, MD;  Location: [facility];  Service: Orthopedics;  Laterality: Left;  . COLONOSCOPY    . OPEN REDUCTION INTERNAL FIXATION (ORIF) DISTAL RADIAL FRACTURE Left 08/04/2018   Procedure: OPEN REDUCTION INTERNAL FIXATION (ORIF) LEFT DISTAL RADIAL FRACTURE;  Surgeon: Milly Jakob, MD;  Location: [facility];  Service: Orthopedics;  Laterality: Left;  . SHOULDER ARTHROSCOPY WITH ROTATOR CUFF REPAIR AND SUBACROMIAL DECOMPRESSION Right 10/31/2013   Procedure: Right shoulder arthroscopy rotator cuff debridement, subacromial decompression, distal clavical excision;  Surgeon: Nita Sells, MD;  Location: [facility];  Service: Orthopedics;  Laterality: Right;  Right shoulder arthroscopy rotator cuff debridement, subacromial decompression, distal clavical excision  . Thumb surgery- joint removed  2009   Arthritis-rt  . THYROID CYST EXCISION  2009  . TONSILLECTOMY     Social History   Occupational History  . Not on file  Tobacco Use  . Smoking status: Former Smoker  Quit date: 10/27/2003    Years since quitting: 16.4  . Smokeless tobacco: Never Used  . Tobacco comment: Quit smoking 2007  Substance and Sexual Activity  . Alcohol use: No    Comment: stopped drinking 2009  . Drug use: No    Comment: past history marijauna 2013  . Sexual activity: Not Currently    Birth control/protection: Surgical

## 2020-03-29 NOTE — Telephone Encounter (Signed)
Received medical records release form from patient today

## 2020-06-14 ENCOUNTER — Telehealth: Payer: Self-pay | Admitting: Neurology

## 2020-06-14 MED ORDER — TOPIRAMATE 25 MG PO TABS: ORAL_TABLET | ORAL | 1 refills | Status: AC

## 2020-06-14 NOTE — Telephone Encounter (Signed)
[id number]  Has called for a refill on topiramate (TOPAMAX) 25 MG tablet to [facility] [phone]

## 2020-06-14 NOTE — Addendum Note (Signed)
Addended by: Brandon Melnick on: 06/14/2020 04:10 PM   Modules accepted: Orders

## 2020-07-02 ENCOUNTER — Ambulatory Visit
Admission: RE | Admit: 2020-07-02 | Discharge: 2020-07-02 | Disposition: A | Discharge status: Home / Self Care | Payer: Medicare HMO | Source: Ambulatory Visit | Attending: Family | Admitting: Family

## 2020-07-02 ENCOUNTER — Other Ambulatory Visit: Payer: Self-pay

## 2020-07-02 ENCOUNTER — Other Ambulatory Visit: Payer: Self-pay | Admitting: Family

## 2020-07-02 DIAGNOSIS — M25471 Effusion, right ankle: Secondary | ICD-10-CM

## 2020-07-02 DIAGNOSIS — M25472 Effusion, left ankle: Secondary | ICD-10-CM

## 2020-07-04 ENCOUNTER — Other Ambulatory Visit: Payer: Self-pay | Admitting: Family

## 2020-07-04 DIAGNOSIS — R911 Solitary pulmonary nodule: Secondary | ICD-10-CM

## 2020-11-27 IMAGING — CT CT ABD-PELV W/ CM
2 of 5 series · 16 of 46 positions shown, 18 images · IV contrast (APPLIED)
Comparison: [DATE]

CLINICAL DATA: Left quadrant abdominal pain

EXAM:
CT ABDOMEN AND PELVIS WITH CONTRAST
TECHNIQUE: Multidetector CT imaging of the abdomen and pelvis was performed
using the standard protocol following bolus administration of
intravenous contrast.
CONTRAST:  100mL OMNIPAQUE IOHEXOL 300 MG/ML  SOLN

[Series 3: abdomen 5.0 · axial · 0.98mm/px · z∈[+1024,+1444]mm · 13 of 100 slices shown, 15 images]
[im 8/100  soft-tissue]
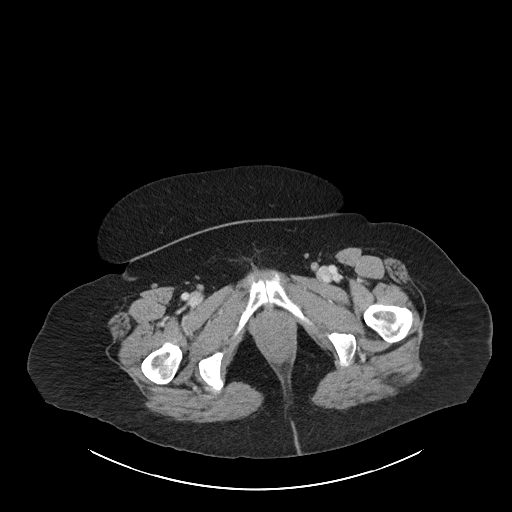
[im 8/100  bone]
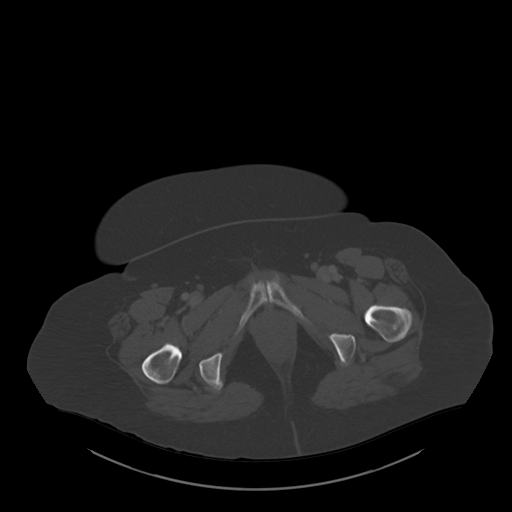
[im 15/100  soft-tissue]
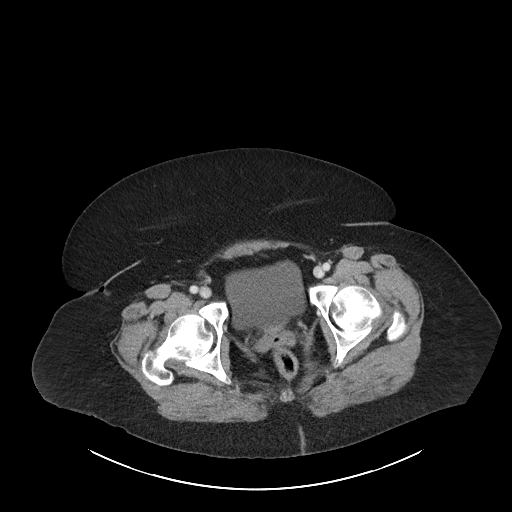
[im 22/100  soft-tissue]
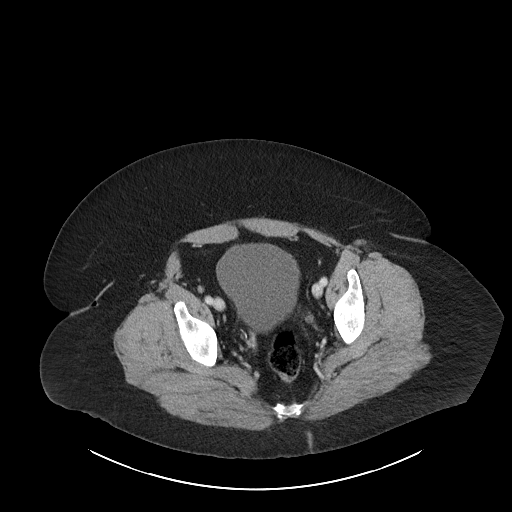
[im 29/100  soft-tissue]
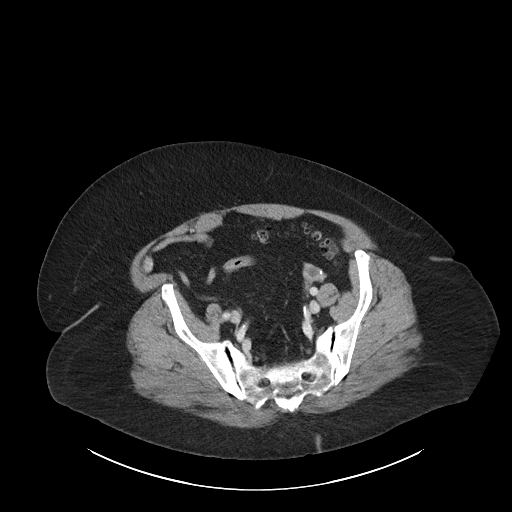
[im 36/100  soft-tissue]
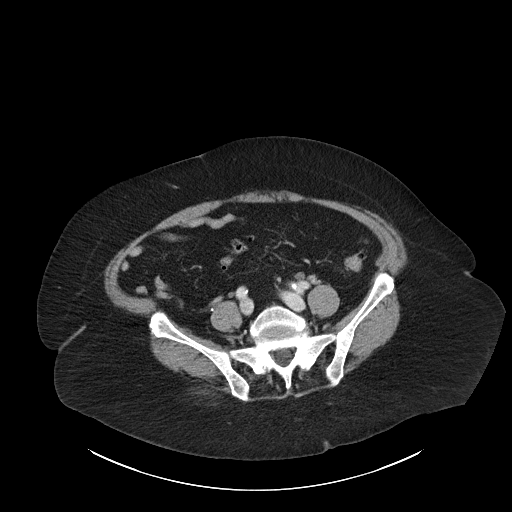
[im 43/100  soft-tissue]
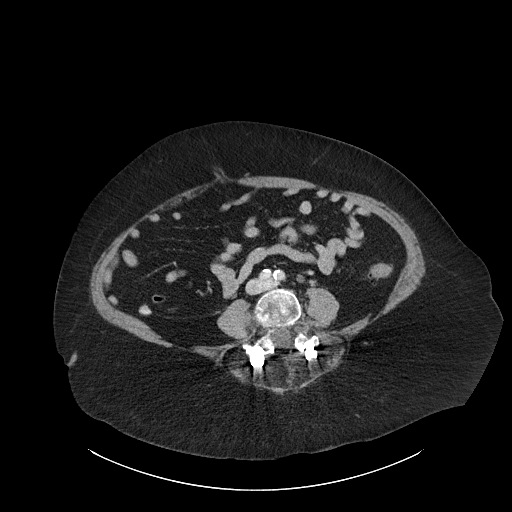
[im 50/100  soft-tissue]
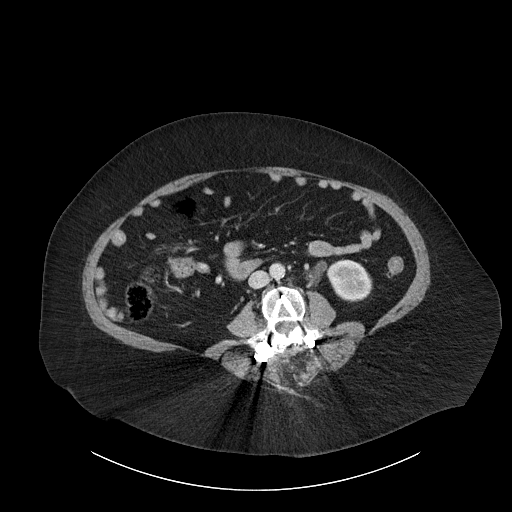
[im 57/100  soft-tissue]
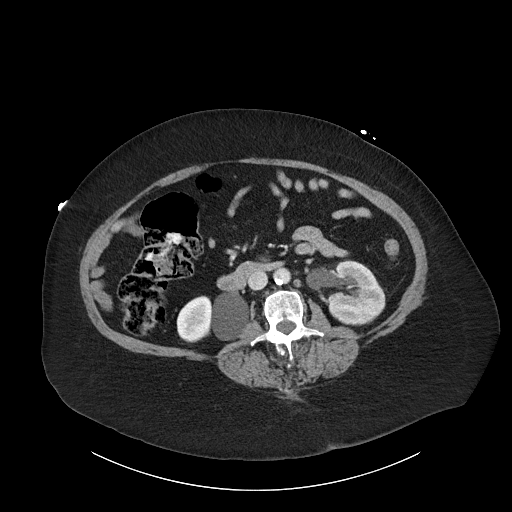
[im 64/100  soft-tissue]
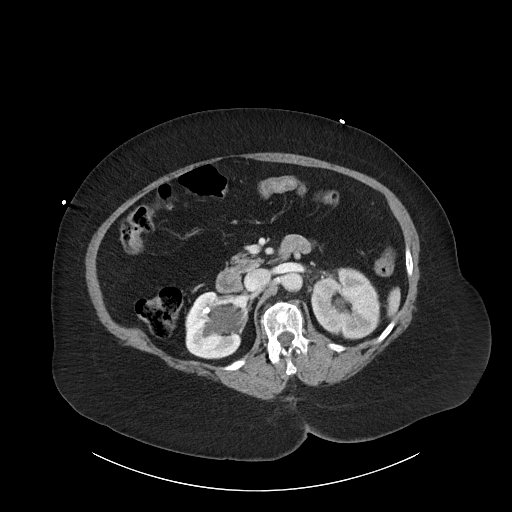
[im 64/100  bone]
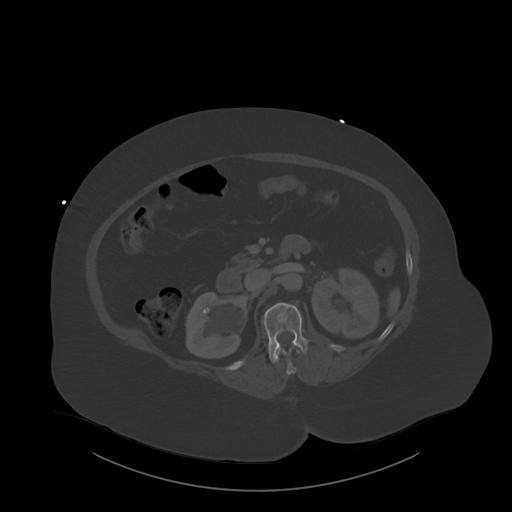
[im 71/100  soft-tissue]
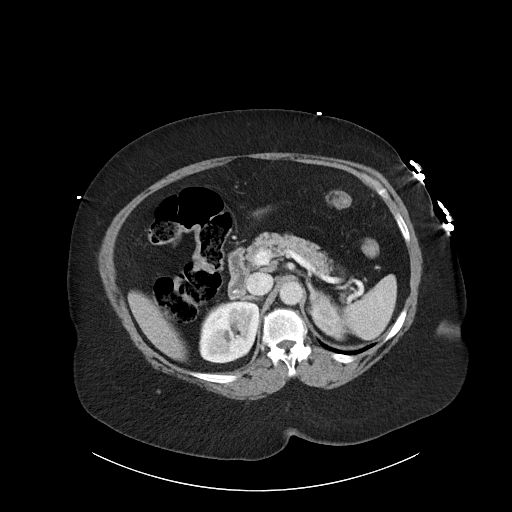
[im 78/100  soft-tissue]
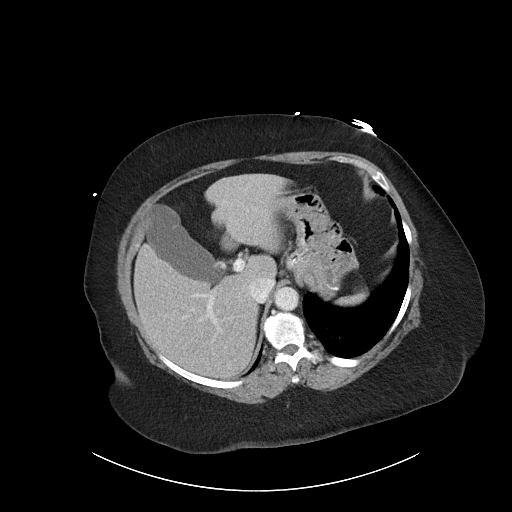
[im 85/100  soft-tissue]
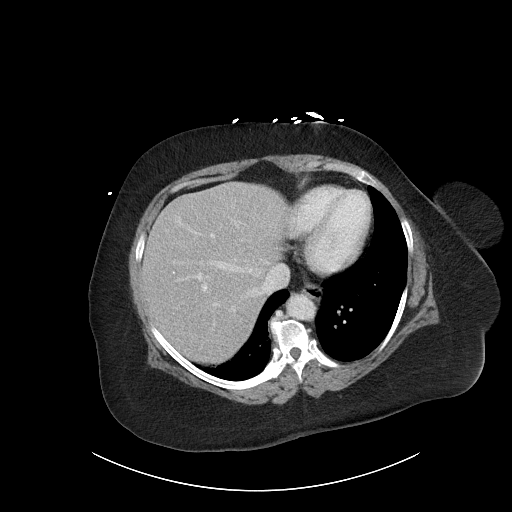
[im 92/100  soft-tissue]
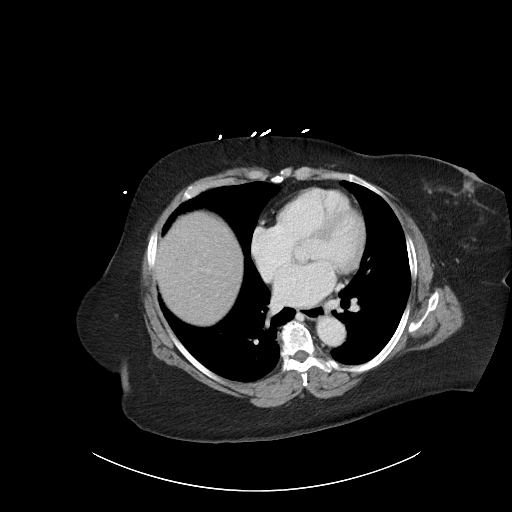

[Series 6: abdomen 3.0 mpr cor · coronal · 0.91mm/px · 3 of 118 slices shown]
[im 40/118  soft-tissue]
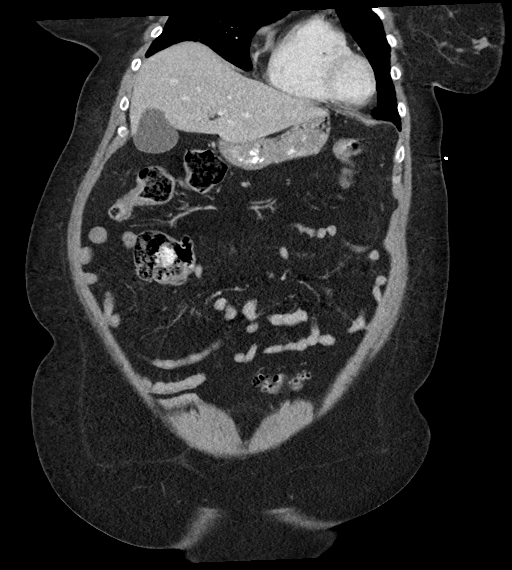
[im 53/118  soft-tissue]
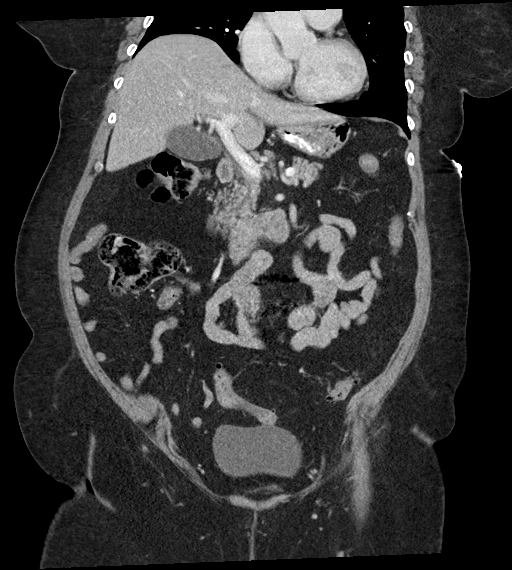
[im 66/118  soft-tissue]
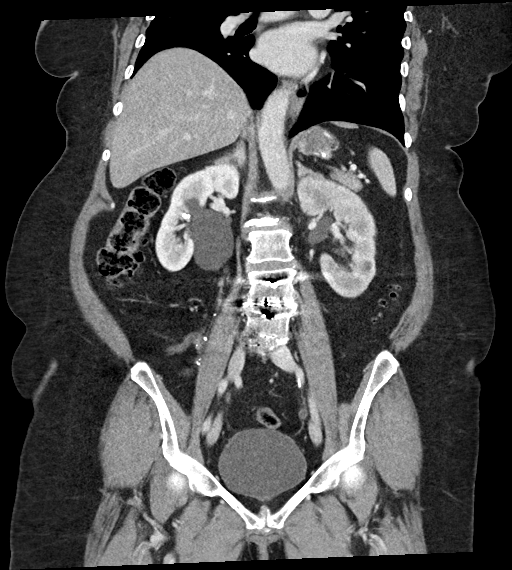

[16 of 46 positions shown; findings below may reference images not displayed]

FINDINGS: Lower chest: The visualized heart size within normal limits. No
pericardial fluid/thickening.

No hiatal hernia.

There is again visualized partially visualized ill-defined
ground-glass opacity at the right lung base measuring 1.9 cm.

Hepatobiliary: The liver is normal in density without focal
abnormality.The main portal vein is patent. No evidence of calcified
gallstones, gallbladder wall thickening or biliary dilatation.

Pancreas: Unremarkable. No pancreatic ductal dilatation or
surrounding inflammatory changes.

Spleen: Normal in size without focal abnormality.

Adrenals/Urinary Tract: Both adrenal glands appear normal. There is
mild left-sided pelvicaliectasis with perinephric and periureteral
stranding to the distal ureter where there is a 4 mm calculus
present. There is an extrarenal right renal pelvis with a probable
duplicated collecting system. There is scattered right lower pole
renal calculi the largest measuring 7 mm.

Stomach/Bowel: The stomach, small bowel are unremarkable. There are
scattered colonic diverticula noted. Significant wall thickening or
surrounding fat stranding changes. The appendix is unremarkable.

Vascular/Lymphatic: There are no enlarged mesenteric,
retroperitoneal, or pelvic lymph nodes. Scattered aortic
atherosclerotic calcifications are seen without aneurysmal
dilatation.

Reproductive: The patient is status post hysterectomy. No adnexal
masses or collections seen.

Other: No evidence of abdominal wall mass or hernia.

Musculoskeletal: No acute or significant osseous findings. Spinal
fixation hardware seen from L3 through L5.
IMPRESSION: Mild left hydronephrosis with perinephric and periureteral stranding
to the distal ureter where there is a 4 mm calculus.

Nonobstructing right renal calculi.

Diverticulosis without diverticulitis.

Aortic Atherosclerosis (445Q1-4Q0.0).

## 2020-12-24 ENCOUNTER — Ambulatory Visit: Payer: Medicare HMO | Admitting: Neurology

## 2020-12-31 ENCOUNTER — Ambulatory Visit: Payer: Medicare HMO | Admitting: Neurology

## 2020-12-31 ENCOUNTER — Encounter: Payer: Self-pay | Admitting: Neurology

## 2020-12-31 ENCOUNTER — Telehealth: Payer: Self-pay | Admitting: Neurology

## 2020-12-31 NOTE — Telephone Encounter (Signed)
This patient did not show for a revisit appointment today.
